# Patient Record
Sex: Female | Born: 1985
Health system: Southern US, Community
[De-identification: ages and names within clinical notes are randomized; demographics above are authoritative.]

## PROBLEM LIST (undated history)

## (undated) DIAGNOSIS — J45909 Unspecified asthma, uncomplicated: Secondary | ICD-10-CM

## (undated) HISTORY — DX: Unspecified asthma, uncomplicated: J45.909

---

## 2017-09-29 DIAGNOSIS — Z719 Counseling, unspecified: Secondary | ICD-10-CM | POA: Diagnosis not present

## 2017-10-24 DIAGNOSIS — Z719 Counseling, unspecified: Secondary | ICD-10-CM | POA: Diagnosis not present

## 2018-04-30 ENCOUNTER — Ambulatory Visit: Payer: 59 | Attending: Family Medicine | Admitting: Family Medicine

## 2018-04-30 ENCOUNTER — Encounter: Payer: Self-pay | Admitting: Family Medicine

## 2018-04-30 VITALS — BP 125/79 | HR 73 | Temp 98.6°F | Resp 18 | Ht 63.0 in | Wt 183.0 lb

## 2018-04-30 DIAGNOSIS — J3089 Other allergic rhinitis: Secondary | ICD-10-CM | POA: Diagnosis not present

## 2018-04-30 DIAGNOSIS — L309 Dermatitis, unspecified: Secondary | ICD-10-CM | POA: Diagnosis not present

## 2018-04-30 DIAGNOSIS — Z7951 Long term (current) use of inhaled steroids: Secondary | ICD-10-CM | POA: Insufficient documentation

## 2018-04-30 DIAGNOSIS — Z760 Encounter for issue of repeat prescription: Secondary | ICD-10-CM | POA: Diagnosis not present

## 2018-04-30 DIAGNOSIS — J4540 Moderate persistent asthma, uncomplicated: Secondary | ICD-10-CM | POA: Diagnosis not present

## 2018-04-30 MED ORDER — MONTELUKAST SODIUM 10 MG PO TABS: 10.0000 mg | ORAL_TABLET | Freq: Every day | ORAL | 4 refills | Status: DC

## 2018-04-30 MED ORDER — BUDESONIDE-FORMOTEROL FUMARATE 80-4.5 MCG/ACT IN AERO: 2.0000 | INHALATION_SPRAY | Freq: Two times a day (BID) | RESPIRATORY_TRACT | 4 refills | Status: DC

## 2018-04-30 MED ORDER — ALBUTEROL SULFATE HFA 108 (90 BASE) MCG/ACT IN AERS: 2.0000 | INHALATION_SPRAY | Freq: Four times a day (QID) | RESPIRATORY_TRACT | 4 refills | Status: DC | PRN

## 2018-04-30 MED ORDER — TRIAMCINOLONE ACETONIDE 0.1 % EX CREA: 1.0000 "application " | TOPICAL_CREAM | Freq: Two times a day (BID) | CUTANEOUS | 11 refills | Status: DC

## 2018-04-30 NOTE — Patient Instructions (Signed)

## 2018-04-30 NOTE — Progress Notes (Signed)
Subjective:    Patient ID: Lauren Terry, female    DOB: 06/26/1986, 32 y.o.   MRN: [id number]  HPI 32 yo female new to the practice who is here as a new patient to establish ongoing care of chronic medical issues including asthma and eczema. Patient reports that she awakens nightly with SOB and chest tightness. Asthma is triggered by fragrances, odors, cleaning chemicals and dust/pollen. Patient is only on albuterol and has to use her inhaler on a daily basis. Patient also with eczema and has used traimcinolone cream which has been helpful. Patient does have recurrent nasal congestion and postnasal drainage but is not on allergy medication.      Family history is significant for mother with Hypertension but no family members with any types of cancer, no heart disease, no strokes and no diabetes except for great maternal aunt. Patient does smoke, 2 cigarettes per day. Patient is single and has 4 children. She is currently employed. Patient declines influenza immunization at today's visit.   No Known Allergies   Review of Systems  Constitutional: Positive for fatigue (Occasional). Negative for chills and fever.  HENT: Positive for congestion and postnasal drip. Negative for sinus pain, sore throat and trouble swallowing.   Respiratory: Positive for chest tightness and shortness of breath. Negative for cough.   Cardiovascular: Negative for chest pain, palpitations and leg swelling.  Gastrointestinal: Negative for abdominal pain and nausea.  Endocrine: Negative for polydipsia, polyphagia and polyuria.  Genitourinary: Negative for dysuria and frequency.  Musculoskeletal: Negative for gait problem and joint swelling.  Allergic/Immunologic: Positive for environmental allergies. Negative for food allergies and immunocompromised state.  Neurological: Negative for dizziness and headaches.       Objective:   Physical Exam  Constitutional: She is oriented to person, place, and time. She appears  well-developed and well-nourished. No distress.  HENT:  Right Ear: Tympanic membrane, external ear and ear canal normal.  Left Ear: Tympanic membrane, external ear and ear canal normal.  Nose: Mucosal edema and rhinorrhea (Mild clear nasal discharge) present.  Mouth/Throat: Oropharynx is clear and moist.  Eyes: Conjunctivae and EOM are normal.  Neck: Normal range of motion. Neck supple. Thyromegaly present.  Cardiovascular: Normal rate and regular rhythm.  Pulmonary/Chest: Effort normal and breath sounds normal.  Abdominal: Soft. There is no tenderness.  Musculoskeletal: She exhibits no edema or tenderness.  Neurological: She is alert and oriented to person, place, and time. No cranial nerve deficit.  Skin: Skin is dry.  Patient with patches of dry skin on her face bilaterally and arms   BP 125/79 (BP Location: Left Arm, Patient Position: Sitting, Cuff Size: Large)   Pulse 73   Temp 98.6 F (37 C) (Oral)   Resp 18   Ht 5\' 3"  (1.6 m)   Wt 183 lb (83 kg)   LMP 04/30/2018   SpO2 99%   BMI 32.42 kg/m      Assessment & Plan:  1. Moderate persistent asthma, unspecified whether complicated Patient with complaint of nightly awakening secondary to shortness of breath and chest tightness.  Patient also with daily use of her inhaler.  Patient with moderate, uncontrolled persistent asthma.  Will add Symbicort 80-4 0.5 to take 2 puffs 2 times daily with as needed use of albuterol.  Patient also provided with prescription for Singulair as this may also help with her allergic rhinitis and asthma.  Patient will follow-up in 4 weeks to see if she has had improvement in her asthma symptoms.  If patient still with daily shortness of breath will increase dose of her Symbicort.  Patient education provided regarding asthma as part of after visit summary.  2. Eczema, unspecified type Patient with eczema and patient was given patient education regarding eczema.  Prescription refill for triamcinolone which  is worked well for the patient in the past.  3. Non-seasonal allergic rhinitis, unspecified trigger Patient will be placed on Singulair which will also help with her allergic rhinitis.  If patient with continued symptoms, suggested that she may also need a nonsedating antihistamine such as Allegra or Claritin.  Allergies as of 04/30/2018   No Known Allergies     Medication List        Accurate as of 04/30/18 11:59 PM. Always use your most recent med list.          albuterol 108 (90 Base) MCG/ACT inhaler Commonly known as:  PROVENTIL HFA;VENTOLIN HFA Inhale 2 puffs into the lungs every 6 (six) hours as needed for wheezing or shortness of breath.   budesonide-formoterol 80-4.5 MCG/ACT inhaler Commonly known as:  SYMBICORT Inhale 2 puffs into the lungs 2 (two) times daily.   montelukast 10 MG tablet Commonly known as:  SINGULAIR Take 1 tablet (10 mg total) by mouth at bedtime.   triamcinolone cream 0.1 % Commonly known as:  KENALOG Apply 1 application topically 2 (two) times daily.     An After Visit Summary was printed and given to the patient.  Return in about 4 weeks (around 05/28/2018) for asthma follow-up.

## 2018-05-28 ENCOUNTER — Encounter: Payer: Self-pay | Admitting: Family Medicine

## 2018-05-28 ENCOUNTER — Other Ambulatory Visit (HOSPITAL_COMMUNITY)
Admission: RE | Admit: 2018-05-28 | Discharge: 2018-05-28 | Disposition: A | Discharge status: Home / Self Care | Payer: 59 | Source: Ambulatory Visit | Attending: Family Medicine | Admitting: Family Medicine

## 2018-05-28 ENCOUNTER — Ambulatory Visit (HOSPITAL_BASED_OUTPATIENT_CLINIC_OR_DEPARTMENT_OTHER): Payer: 59 | Admitting: Family Medicine

## 2018-05-28 VITALS — BP 121/76 | HR 59 | Temp 98.3°F | Ht 63.0 in | Wt 181.2 lb

## 2018-05-28 DIAGNOSIS — N898 Other specified noninflammatory disorders of vagina: Secondary | ICD-10-CM | POA: Insufficient documentation

## 2018-05-28 DIAGNOSIS — N6310 Unspecified lump in the right breast, unspecified quadrant: Secondary | ICD-10-CM | POA: Insufficient documentation

## 2018-05-28 DIAGNOSIS — F17210 Nicotine dependence, cigarettes, uncomplicated: Secondary | ICD-10-CM | POA: Insufficient documentation

## 2018-05-28 DIAGNOSIS — N63 Unspecified lump in unspecified breast: Secondary | ICD-10-CM | POA: Insufficient documentation

## 2018-05-28 DIAGNOSIS — N6341 Unspecified lump in right breast, subareolar: Secondary | ICD-10-CM | POA: Diagnosis not present

## 2018-05-28 DIAGNOSIS — Z124 Encounter for screening for malignant neoplasm of cervix: Secondary | ICD-10-CM

## 2018-05-28 DIAGNOSIS — J4540 Moderate persistent asthma, uncomplicated: Secondary | ICD-10-CM | POA: Diagnosis not present

## 2018-05-28 DIAGNOSIS — Z79899 Other long term (current) drug therapy: Secondary | ICD-10-CM | POA: Insufficient documentation

## 2018-05-28 MED ORDER — TRIAMCINOLONE ACETONIDE 0.5 % EX OINT: 1.0000 "application " | TOPICAL_OINTMENT | Freq: Two times a day (BID) | CUTANEOUS | 6 refills | Status: DC

## 2018-05-28 NOTE — Progress Notes (Signed)
Subjective:    Patient ID: Lauren Terry, female    DOB: 04/26/1986, 32 y.o.   MRN: [id number]  HPI 32 yo female who presents secondary to needing her pap smear done and patient with complaint of recently noticing bruising to her right breast as well as a palpable nodule. Patient does not recall any specific injury to her right breast that may have caused the bruising. Patient states that she has to lift objects at work and perhaps something did hit her breast. Patient does not have any breast pain other than some discomfort if she touches the bruised area. There is no family history of breast cancer or other GYN cancer. Patient has had no prior abnormal pap smears and denies any current abdominal or pelvic pain. Patient has had some vaginal discharge that is mostly clear.        Patient is taking the asthma medications prescribed at her last visit and has had decreased SOB and decreased nighttime awakenings with SOB and chest tightness. Pt continues to smoke a few cigarettes daily. No past surgeries.     Review of Systems  Constitutional: Negative for chills, fatigue and fever.  Respiratory: Negative for cough, chest tightness, shortness of breath and wheezing.   Cardiovascular: Negative for chest pain, palpitations and leg swelling.  Gastrointestinal: Negative for abdominal pain and nausea.  Endocrine: Negative for polydipsia, polyphagia and polyuria.  Genitourinary: Positive for vaginal discharge. Negative for dysuria, frequency, pelvic pain and vaginal pain.  Musculoskeletal: Negative for arthralgias and back pain.  Neurological: Negative for dizziness and headaches.       Objective:   Physical Exam  Constitutional: She appears well-developed and well-nourished. No distress.  Neck: Normal range of motion. Neck supple.  Cardiovascular: Normal rate.  Pulmonary/Chest: Effort normal and breath sounds normal. She has no wheezes.  Abdominal: Soft. Bowel sounds are normal. There is no  tenderness.  Genitourinary: Vagina normal and uterus normal. Vaginal discharge: mucoid clear to light yellow discharge on the cervix and nearby vaginal canal.  Genitourinary Comments: Normal appearance to the cervix and no cervical motion tenderness and no adnexal tenderness  Musculoskeletal: She exhibits no edema or tenderness.  Lymphadenopathy:    She has no cervical adenopathy.  Skin:  On examination of the right breast, patient with a large bruise-somewhat circular with outside of bruise being purple and inner portion a yellow-green in color; area is slightly tender to palp and center of the breast at about 12 o'clock has a palpable somewhat firm nodule about an inch above the areola. No left or right axillary adenopathy and left breast exam is normal   BP 121/76   Pulse (!) 59   Temp 98.3 F (36.8 C) (Oral)   Ht 5\' 3"  (1.6 m)   Wt 181 lb 3.2 oz (82.2 kg)   LMP 04/30/2018   SpO2 99%   BMI 32.10 kg/m  vital signs and nurse's note reviewed        Assessment & Plan:  1. Screening for cervical cancer Pap done at today's visit as a screening test for cervical cancer and patient will be notified of the results - Cytology - PAP  2. Breast nodule Patient with an area of bruising on the right breast as well as a palpable nodule in this same area. Patient will be scheduled for diagnostic mammogram - MM Digital Diagnostic Bilat; Future  3. Vaginal discharge Sample of vaginal discharge obtained while performing pap smear and will be sent for testing and patient  will be notified if any further treatment is needed based on these results - Cytology - PAP  *Patient was offered influenza immunization at today's visit which she declined  An After Visit Summary was printed and given to the patient.  Return in about 6 months (around 11/26/2018) for asthma .

## 2018-05-29 LAB — CYTOLOGY - PAP
Bacterial vaginitis: NEGATIVE
Candida vaginitis: NEGATIVE
Chlamydia: NEGATIVE
Diagnosis: NEGATIVE
Neisseria Gonorrhea: NEGATIVE
Trichomonas: NEGATIVE

## 2018-05-30 ENCOUNTER — Telehealth: Payer: Self-pay

## 2018-05-30 DIAGNOSIS — J4540 Moderate persistent asthma, uncomplicated: Secondary | ICD-10-CM | POA: Insufficient documentation

## 2018-05-30 NOTE — Telephone Encounter (Signed)
Patient was called, verified dob, and was given most recent lab results. Patient verbalized understanding and had no further questions.  

## 2018-05-30 NOTE — Telephone Encounter (Signed)
-----   Message from Cain Saupeammie Fulp, MD sent at 05/29/2018  5:18 PM EDT ----- Notify patient of normal pap smear and no evidence of infection

## 2018-06-12 ENCOUNTER — Other Ambulatory Visit: Payer: Self-pay | Admitting: Family Medicine

## 2018-06-12 DIAGNOSIS — N63 Unspecified lump in unspecified breast: Secondary | ICD-10-CM

## 2018-06-26 ENCOUNTER — Other Ambulatory Visit: Payer: Self-pay | Admitting: Family Medicine

## 2018-06-26 DIAGNOSIS — N63 Unspecified lump in unspecified breast: Secondary | ICD-10-CM

## 2018-07-04 ENCOUNTER — Other Ambulatory Visit: Payer: 59

## 2018-07-19 ENCOUNTER — Ambulatory Visit
Admission: RE | Admit: 2018-07-19 | Discharge: 2018-07-19 | Disposition: A | Discharge status: Home / Self Care | Payer: 59 | Source: Ambulatory Visit | Attending: Family Medicine | Admitting: Family Medicine

## 2018-07-19 DIAGNOSIS — N63 Unspecified lump in unspecified breast: Secondary | ICD-10-CM

## 2018-07-19 DIAGNOSIS — N6489 Other specified disorders of breast: Secondary | ICD-10-CM | POA: Diagnosis not present

## 2018-07-19 DIAGNOSIS — R928 Other abnormal and inconclusive findings on diagnostic imaging of breast: Secondary | ICD-10-CM | POA: Diagnosis not present

## 2018-07-27 ENCOUNTER — Other Ambulatory Visit: Payer: 59

## 2018-07-30 ENCOUNTER — Telehealth (INDEPENDENT_AMBULATORY_CARE_PROVIDER_SITE_OTHER): Payer: Self-pay

## 2018-07-30 NOTE — Telephone Encounter (Signed)
-----   Message from Cain Saupeammie Fulp, MD sent at 07/20/2018  6:16 PM EST ----- Notify patient that mammogram and US were normal; patient does have category B breast tissue which indicates presence of scattered fibroglandular density

## 2018-07-30 NOTE — Telephone Encounter (Signed)
Patient verified DOB. She is aware that mammogram and US were normal. She was informed that she has category B breast tissue, meaning there is presence of scattered fibroglandular density. Patient asked about pap results and was informed that pap was normal. Maryjean Mornempestt S Roberts, CMA

## 2018-08-28 DIAGNOSIS — J069 Acute upper respiratory infection, unspecified: Secondary | ICD-10-CM | POA: Diagnosis not present

## 2018-10-22 ENCOUNTER — Emergency Department (HOSPITAL_COMMUNITY)
Admission: EM | Admit: 2018-10-22 | Discharge: 2018-10-22 | Disposition: A | Discharge status: Home / Self Care | Payer: 59 | Attending: Emergency Medicine | Admitting: Emergency Medicine

## 2018-10-22 ENCOUNTER — Other Ambulatory Visit: Payer: Self-pay

## 2018-10-22 ENCOUNTER — Encounter (HOSPITAL_COMMUNITY): Payer: Self-pay | Admitting: Obstetrics and Gynecology

## 2018-10-22 DIAGNOSIS — Z79899 Other long term (current) drug therapy: Secondary | ICD-10-CM | POA: Diagnosis not present

## 2018-10-22 DIAGNOSIS — J45909 Unspecified asthma, uncomplicated: Secondary | ICD-10-CM | POA: Diagnosis not present

## 2018-10-22 DIAGNOSIS — F17210 Nicotine dependence, cigarettes, uncomplicated: Secondary | ICD-10-CM | POA: Insufficient documentation

## 2018-10-22 DIAGNOSIS — R109 Unspecified abdominal pain: Secondary | ICD-10-CM

## 2018-10-22 DIAGNOSIS — R1013 Epigastric pain: Secondary | ICD-10-CM | POA: Insufficient documentation

## 2018-10-22 DIAGNOSIS — R112 Nausea with vomiting, unspecified: Secondary | ICD-10-CM | POA: Diagnosis not present

## 2018-10-22 LAB — I-STAT BETA HCG BLOOD, ED (MC, WL, AP ONLY): I-stat hCG, quantitative: <5 m[IU]/mL (ref <5)

## 2018-10-22 LAB — COMPREHENSIVE METABOLIC PANEL
ALT: 21 U/L (ref 0–44)
AST: 24 U/L (ref 15–41)
Albumin: 4 g/dL (ref 3.5–5.0)
Alkaline Phosphatase: 83 U/L (ref 38–126)
Anion gap: 7 (ref 5–15)
BUN: 11 mg/dL (ref 6–20)
CO2: 24 mmol/L (ref 22–32)
Calcium: 8.7 mg/dL — ABNORMAL LOW (ref 8.9–10.3)
Chloride: 106 mmol/L (ref 98–111)
Creatinine, Ser: 0.75 mg/dL (ref 0.44–1.00)
GFR calc Af Amer: >60 mL/min (ref >60)
GFR calc non Af Amer: >60 mL/min (ref >60)
Glucose, Bld: 98 mg/dL (ref 70–99)
Potassium: 3.7 mmol/L (ref 3.5–5.1)
Sodium: 137 mmol/L (ref 135–145)
Total Bilirubin: 0.2 mg/dL — ABNORMAL LOW (ref 0.3–1.2)
Total Protein: 7.2 g/dL (ref 6.5–8.1)

## 2018-10-22 LAB — URINALYSIS, ROUTINE W REFLEX MICROSCOPIC
Bacteria, UA: NONE SEEN
Bilirubin Urine: NEGATIVE
Glucose, UA: NEGATIVE mg/dL
Ketones, ur: NEGATIVE mg/dL
Leukocytes, UA: NEGATIVE
Nitrite: NEGATIVE
Protein, ur: NEGATIVE mg/dL
Specific Gravity, Urine: 1.004 — ABNORMAL LOW (ref 1.005–1.030)
pH: 7 (ref 5.0–8.0)

## 2018-10-22 LAB — CBC
HCT: 38.6 % (ref 36.0–46.0)
Hemoglobin: 11.7 g/dL — ABNORMAL LOW (ref 12.0–15.0)
MCH: 27.7 pg (ref 26.0–34.0)
MCHC: 30.3 g/dL (ref 30.0–36.0)
MCV: 91.5 fL (ref 80.0–100.0)
Platelets: 326 10*3/uL (ref 150–400)
RBC: 4.22 MIL/uL (ref 3.87–5.11)
RDW: 14.8 % (ref 11.5–15.5)
WBC: 9.8 10*3/uL (ref 4.0–10.5)
nRBC: 0 % (ref 0.0–0.2)

## 2018-10-22 LAB — LIPASE, BLOOD: Lipase: 33 U/L (ref 11–51)

## 2018-10-22 MED ORDER — IPRATROPIUM-ALBUTEROL 0.5-2.5 (3) MG/3ML IN SOLN
3.0000 mL | Freq: Once | RESPIRATORY_TRACT | Status: AC
  Administered 2018-10-22: 3 mL via RESPIRATORY_TRACT
  Filled 2018-10-22: qty 3

## 2018-10-22 MED ORDER — SODIUM CHLORIDE 0.9% FLUSH
3.0000 mL | Freq: Once | INTRAVENOUS | Status: AC
  Administered 2018-10-22: 3 mL via INTRAVENOUS

## 2018-10-22 MED ORDER — ALUM & MAG HYDROXIDE-SIMETH 200-200-20 MG/5ML PO SUSP
30.0000 mL | Freq: Once | ORAL | Status: AC
  Administered 2018-10-22: 30 mL via ORAL
  Filled 2018-10-22: qty 30

## 2018-10-22 MED ORDER — FAMOTIDINE 20 MG PO TABS
20.0000 mg | ORAL_TABLET | Freq: Once | ORAL | Status: AC
  Administered 2018-10-22: 20 mg via ORAL
  Filled 2018-10-22: qty 1

## 2018-10-22 MED ORDER — FAMOTIDINE 20 MG PO TABS: 20.0000 mg | ORAL_TABLET | Freq: Two times a day (BID) | ORAL | 0 refills | Status: DC

## 2018-10-22 NOTE — ED Provider Notes (Signed)
[facility]-EMERGENCY DEPT Provider Note   CSN: [id number] Arrival date & time: 10/22/18  4098     History   Chief Complaint Chief Complaint  Patient presents with  . Abdominal Pain    HPI Lauren Terry is a 33 y.o. female.  HPI   Pt is a 33 y/o female with a h/o asthma who presents to the ED today c/o abd pain that began last night. Pain woke her from sleep. Pain located to the epigastric area.  Pain has improved since onset and she currently does not have pain. Last night pain was worse and was rated at 8/10.  Pain is radiating to the back.  Pain is constant. She reports chills, HA, nausea, and constipation. No diarrhea, vomiting, bloody stools. Still passing flatus. No urinary sxs.  She had fried chicken wings last night with a salad.   She denies chest pain. She has some shortness of breath that she states is chronic for her. She has asthma and is supposed to take symbicort and albuterol but she does not take her symbicort so has more wheezing today.   Also reports some mild uri sxs, with cough, congestion, rhinorrhea that began a few days ago.  She states she has a chronic cough because of rapid asthma and it is not worse today.  Past Medical History:  Diagnosis Date  . Asthma     Patient Active Problem List   Diagnosis Date Noted  . Moderate persistent asthma 05/30/2018    No past surgical history on file.   OB History    Gravida      Para      Term      Preterm      AB      Living  4     SAB      TAB      Ectopic      Multiple      Live Births               Home Medications    Prior to Admission medications   Medication Sig Start Date End Date Taking? Authorizing Provider  acetaminophen (TYLENOL) 325 MG tablet Take 650 mg by mouth every 6 (six) hours as needed for mild pain or headache.   Yes [provider]  albuterol (PROVENTIL HFA;VENTOLIN HFA) 108 (90 Base) MCG/ACT inhaler Inhale 2 puffs into the  lungs every 6 (six) hours as needed for wheezing or shortness of breath. 04/30/18  Yes Fulp, Cammie, MD  Pseudoeph-Doxylamine-DM-APAP (NYQUIL PO) Take 1 capsule by mouth at bedtime as needed (congestion/sleep).   Yes [provider]  triamcinolone ointment (KENALOG) 0.5 % Apply 1 application topically 2 (two) times daily. Patient taking differently: Apply 1 application topically 2 (two) times daily as needed (eczema).  05/28/18  Yes Fulp, Cammie, MD  budesonide-formoterol (SYMBICORT) 80-4.5 MCG/ACT inhaler Inhale 2 puffs into the lungs 2 (two) times daily. Patient not taking: Reported on 10/22/2018 04/30/18   Fulp, Hewitt Shorts, MD  famotidine (PEPCID) 20 MG tablet Take 1 tablet (20 mg total) by mouth 2 (two) times daily for 14 days. 10/22/18 11/05/18  Quinetta Shilling S, PA-C  montelukast (SINGULAIR) 10 MG tablet Take 1 tablet (10 mg total) by mouth at bedtime. Patient not taking: Reported on 10/22/2018 04/30/18   Cain Saupe, MD    Family History Family History  Problem Relation Age of Onset  . Diabetes Maternal Aunt   . Cancer Paternal Grandmother   . Breast cancer  Paternal Grandmother     Social History Social History   Tobacco Use  . Smoking status: Current Some Day Smoker    Packs/day: 0.01    Types: Cigarettes  . Smokeless tobacco: Never Used  Substance Use Topics  . Alcohol use: Not Currently  . Drug use: Yes    Types: Marijuana     Allergies   Patient has no known allergies.   Review of Systems Review of Systems  Constitutional: Negative for chills and fever.  HENT: Positive for congestion and rhinorrhea. Negative for ear pain and sore throat.   Eyes: Negative for visual disturbance.  Respiratory: Positive for cough and wheezing. Negative for shortness of breath.   Cardiovascular: Negative for chest pain.  Gastrointestinal: Positive for abdominal pain, constipation, nausea and vomiting. Negative for blood in stool and diarrhea.  Genitourinary: Negative for dysuria,  flank pain and hematuria.  Musculoskeletal: Negative for back pain.  Skin: Negative for color change and rash.  Neurological: Negative for seizures, syncope and headaches.  All other systems reviewed and are negative.    Physical Exam Updated Vital Signs BP 129/76 (BP Location: Right Arm)   Pulse 93   Temp 98.7 F (37.1 C) (Oral)   Resp 17   LMP 10/22/2018   SpO2 100%   Physical Exam Vitals signs and nursing note reviewed.  Constitutional:      General: She is not in acute distress.    Appearance: She is well-developed. She is not ill-appearing or toxic-appearing.     Comments: Patient appears very comfortable.  HENT:     Head: Normocephalic and atraumatic.  Eyes:     Conjunctiva/sclera: Conjunctivae normal.  Neck:     Musculoskeletal: Neck supple.  Cardiovascular:     Rate and Rhythm: Normal rate and regular rhythm.     Heart sounds: Normal heart sounds. No murmur.  Pulmonary:     Effort: Pulmonary effort is normal. No respiratory distress.     Comments: Bilateral upper expiratory wheezing.  No rales or rhonchi.  No tachypnea.  Speaking in full sentences. Abdominal:     Palpations: Abdomen is soft.     Tenderness: There is no right CVA tenderness or left CVA tenderness.     Comments: Very mild left upper quadrant tenderness but abdomen is soft, there is no rebound, rigidity or guarding.  Bowel sounds are normal.  Skin:    General: Skin is warm and dry.  Neurological:     Mental Status: She is alert.  Psychiatric:        Mood and Affect: Mood normal.      ED Treatments / Results  Labs (all labs ordered are listed, but only abnormal results are displayed) Labs Reviewed  COMPREHENSIVE METABOLIC PANEL - Abnormal; Notable for the following components:      Result Value   Calcium 8.7 (*)    Total Bilirubin 0.2 (*)    All other components within normal limits  CBC - Abnormal; Notable for the following components:   Hemoglobin 11.7 (*)    All other components  within normal limits  URINALYSIS, ROUTINE W REFLEX MICROSCOPIC - Abnormal; Notable for the following components:   Specific Gravity, Urine 1.004 (*)    Hgb urine dipstick MODERATE (*)    All other components within normal limits  LIPASE, BLOOD  I-STAT BETA HCG BLOOD, ED ([facility], [facility], [facility] ONLY)    EKG None  Radiology No results found.  Procedures Procedures (including critical care time)  Medications Ordered in  ED Medications  sodium chloride flush (NS) 0.9 % injection 3 mL (3 mLs Intravenous Given 10/22/18 0826)  famotidine (PEPCID) tablet 20 mg (20 mg Oral Given 10/22/18 0909)  alum & mag hydroxide-simeth (MAALOX/MYLANTA) 200-200-20 MG/5ML suspension 30 mL (30 mLs Oral Given 10/22/18 0909)  ipratropium-albuterol (DUONEB) 0.5-2.5 (3) MG/3ML nebulizer solution 3 mL (3 mLs Nebulization Given 10/22/18 0910)     Initial Impression / Assessment and Plan / ED Course  I have reviewed the triage vital signs and the nursing notes.  Pertinent labs & imaging results that were available during my care of the patient were reviewed by me and considered in my medical decision making (see chart for details).     Final Clinical Impressions(s) / ED Diagnoses   Final diagnoses:  Abdominal pain, unspecified abdominal location   Patient presenting the emergency department today with epigastric pain that radiated to her back when she woke up this morning.  It is associated with nausea but no vomiting.  No diarrhea but mild constipation.  No fevers or chills.  No chest pain.  She has chronic shortness of breath and a cough from her asthma that is unchanged today.  She has mild congestion and rhinorrhea but no fevers.  Exam she is very well-appearing.  Her vital signs are normal.  Her abdomen is soft.  She has very minimal tenderness to the left upper quadrant without guarding, rebound or rigidity.  She states her symptoms have improved since being here.  She does endorse having spicy/fried meal last night  before going to bed.  Her labs are reassuring patient has no leukocytosis.  She has mild anemia.  Electrolytes are normal.  Kidney and liver function are normal.  Lipase is negative.  UA is negative for urinary tract infection.  She does have hematuria, she is on her menses.  I suspect that her symptoms were secondary to GERD versus gastritis given her history.  Her symptoms have already improved significantly without intervention.  Pepcid and Maalox given in the ED and patient feels improved after medications.   Also given a DuoNeb that she is due for her albuterol inhaler. Abdomen soft and nontender. Wheezing improved. She has not been using her Symbicort, I advised her on importance of using this.  I will give her Rx for Pepcid for home and have her follow-up with her PCP.  Have advised her to return to the ER for new or worsening symptoms in the meantime.  She voiced understanding the plan and reasons return.  Questions answered.  Patient stable for discharge.  ED Discharge Orders         Ordered    famotidine (PEPCID) 20 MG tablet  2 times daily     10/22/18 0916           Karrie Meres, PA-C 10/22/18 1015    Derwood Kaplan, MD 10/23/18 [phone]

## 2018-10-22 NOTE — Discharge Instructions (Addendum)
Take prescriptions as advised.   Please follow up with your primary doctor within the next 5-7 days.  If you do not have a primary care provider, information for a healthcare clinic has been provided for you to make arrangements for follow up care. Please return to the ER sooner if you have any new or worsening symptoms, or if you have any of the following symptoms:  Abdominal pain that does not go away.  You have a fever.  You keep throwing up (vomiting).  The pain is felt only in portions of the abdomen. Pain in the right side could possibly be appendicitis. In an adult, pain in the left lower portion of the abdomen could be colitis or diverticulitis.  You pass bloody or black tarry stools.  There is bright red blood in the stool.  The constipation stays for more than 4 days.  There is belly (abdominal) or rectal pain.  You do not seem to be getting better.  You have any questions or concerns.

## 2018-10-22 NOTE — ED Triage Notes (Signed)
Pt reports upper abdominal pain since last night. Pt reports her son recently had the flu. Pt reports she is not pregnant at this time and is currently on her cycle. Pt denies diarrhea, and reports some difficulty with bowel movements. Pt denies nausea and emesis.

## 2018-11-12 ENCOUNTER — Encounter: Payer: Self-pay | Admitting: Family Medicine

## 2018-11-12 ENCOUNTER — Ambulatory Visit: Payer: 59 | Attending: Family Medicine | Admitting: Family Medicine

## 2018-11-12 VITALS — BP 111/64 | HR 73 | Temp 97.5°F | Ht 63.0 in | Wt 178.2 lb

## 2018-11-12 DIAGNOSIS — B9689 Other specified bacterial agents as the cause of diseases classified elsewhere: Secondary | ICD-10-CM

## 2018-11-12 DIAGNOSIS — N760 Acute vaginitis: Secondary | ICD-10-CM | POA: Diagnosis not present

## 2018-11-12 MED ORDER — METRONIDAZOLE 500 MG PO TABS: 500.0000 mg | ORAL_TABLET | Freq: Two times a day (BID) | ORAL | 0 refills | Status: DC

## 2018-11-12 NOTE — Progress Notes (Signed)
Subjective:  Patient ID: Lauren Terry, female    DOB: Dec 10, 1985  Age: 33 y.o. MRN: [id number]  CC: Hospitalization Follow-up   HPI Lauren Terry presents today complaining of a vaginal odor which she has had for the last couple of days.  She stayed in a hotel and had to use the hotel soaps and lotion and developed symptoms subsequently.  Denies vaginal itching, dysuria, discharge and has the same sexual partner -does not use protection.  She had an ED visit for abdominal pain last month where she was treated for gastritis and endorses resolution of symptoms.  Past Medical History:  Diagnosis Date  . Asthma     History reviewed. No pertinent surgical history.  Family History  Problem Relation Age of Onset  . Diabetes Maternal Aunt   . Cancer Paternal Grandmother   . Breast cancer Paternal Grandmother     No Known Allergies  Outpatient Medications Prior to Visit  Medication Sig Dispense Refill  . albuterol (PROVENTIL HFA;VENTOLIN HFA) 108 (90 Base) MCG/ACT inhaler Inhale 2 puffs into the lungs every 6 (six) hours as needed for wheezing or shortness of breath. 1 Inhaler 4  . budesonide-formoterol (SYMBICORT) 80-4.5 MCG/ACT inhaler Inhale 2 puffs into the lungs 2 (two) times daily. 1 Inhaler 4  . triamcinolone ointment (KENALOG) 0.5 % Apply 1 application topically 2 (two) times daily. (Patient taking differently: Apply 1 application topically 2 (two) times daily as needed (eczema). ) 30 g 6  . acetaminophen (TYLENOL) 325 MG tablet Take 650 mg by mouth every 6 (six) hours as needed for mild pain or headache.    . famotidine (PEPCID) 20 MG tablet Take 1 tablet (20 mg total) by mouth 2 (two) times daily for 14 days. 28 tablet 0  . montelukast (SINGULAIR) 10 MG tablet Take 1 tablet (10 mg total) by mouth at bedtime. (Patient not taking: Reported on 10/22/2018) 30 tablet 4  . Pseudoeph-Doxylamine-DM-APAP (NYQUIL PO) Take 1 capsule by mouth at bedtime as needed (congestion/sleep).       No facility-administered medications prior to visit.      ROS Review of Systems General: negative for fever, weight loss, appetite change Eyes: no visual symptoms. ENT: no ear symptoms, no sinus tenderness, no nasal congestion or sore throat. Neck: no pain  Respiratory: no wheezing, shortness of breath, cough Cardiovascular: no chest pain, no dyspnea on exertion, no pedal edema, no orthopnea. Gastrointestinal: no abdominal pain, no diarrhea, no constipation Genito-Urinary: no urinary frequency, no dysuria, no polyuria, + vaginal odor Hematologic: no bruising Endocrine: no cold or heat intolerance Neurological: no headaches, no seizures, no tremors Musculoskeletal: no joint pains, no joint swelling Skin: no pruritus, no rash. Psychological: no depression, no anxiety,    Objective:  BP 111/64   Pulse 73   Temp (!) 97.5 F (36.4 C) (Oral)   Ht 5\' 3"  (1.6 m)   Wt 178 lb 3.2 oz (80.8 kg)   LMP 10/22/2018   SpO2 98%   BMI 31.57 kg/m   BP/Weight 11/12/2018 10/22/2018 05/28/2018  Systolic BP 111 130 121  Diastolic BP 64 85 76  Wt. (Lbs) 178.2 - 181.2  BMI 31.57 - 32.1      Physical Exam Constitutional: normal appearing,  Eyes: PERRLA HEENT: Head is atraumatic, normal sinuses, normal oropharynx, normal appearing tonsils and palate, tympanic membrane is normal bilaterally. Neck: normal range of motion, no thyromegaly, no JVD Cardiovascular: normal rate and rhythm, normal heart sounds, no murmurs, rub or gallop, no pedal edema Respiratory:  Normal breath sounds, clear to auscultation bilaterally, no wheezes, no rales, no rhonchi Abdomen: soft, not tender to palpation, normal bowel sounds, no enlarged organs Musculoskeletal: Full ROM, no tenderness in joints Skin: warm and dry, no lesions. Neurological: alert, oriented x3, cranial nerves I-XII grossly intact , normal motor strength, normal sensation. Psychological: normal mood.   CMP Latest Ref Rng & Units 10/22/2018   Glucose 70 - 99 mg/dL 98  BUN 6 - 20 mg/dL 11  Creatinine 4.32 - 7.61 mg/dL 4.70  Sodium 929 - 574 mmol/L 137  Potassium 3.5 - 5.1 mmol/L 3.7  Chloride 98 - 111 mmol/L 106  CO2 22 - 32 mmol/L 24  Calcium 8.9 - 10.3 mg/dL 7.3(U)  Total Protein 6.5 - 8.1 g/dL 7.2  Total Bilirubin 0.3 - 1.2 mg/dL 0.3(J)  Alkaline Phos 38 - 126 U/L 83  AST 15 - 41 U/L 24  ALT 0 - 44 U/L 21    Lipid Panel  No results found for: CHOL, TRIG, HDL, CHOLHDL, VLDL, LDLCALC, LDLDIRECT  CBC    Component Value Date/Time   WBC 9.8 10/22/2018 0737   RBC 4.22 10/22/2018 0737   HGB 11.7 (L) 10/22/2018 0737   HCT 38.6 10/22/2018 0737   PLT 326 10/22/2018 0737   MCV 91.5 10/22/2018 0737   MCH 27.7 10/22/2018 0737   MCHC 30.3 10/22/2018 0737   RDW 14.8 10/22/2018 0737      Assessment & Plan:   1. Bacterial vaginosis - metroNIDAZOLE (FLAGYL) 500 MG tablet; Take 1 tablet (500 mg total) by mouth 2 (two) times daily.  Dispense: 14 tablet; Refill: 0   Meds ordered this encounter  Medications  . metroNIDAZOLE (FLAGYL) 500 MG tablet    Sig: Take 1 tablet (500 mg total) by mouth 2 (two) times daily.    Dispense:  14 tablet    Refill:  0    Follow-up: Return if symptoms worsen or fail to improve.       Hoy Register, MD, FAAFP. [facility] [place], [place] [phone]   11/12/2018, 3:56 PM

## 2018-11-12 NOTE — Progress Notes (Signed)
Patient states she has a vaginal odor.

## 2018-12-06 ENCOUNTER — Telehealth: Payer: Self-pay | Admitting: Family Medicine

## 2018-12-06 ENCOUNTER — Encounter: Payer: Self-pay | Admitting: Family Medicine

## 2018-12-06 NOTE — Progress Notes (Signed)
Patient ID: Lauren Terry, female   DOB: Dec 14, 1985, 33 y.o.   MRN: [id number]   Message received that patient that patient needs a letter to give to her employer that patient has a diagnosis of asthma. Letter will be typed and given to CMA to contact patient regarding letter pick-up.

## 2018-12-06 NOTE — Telephone Encounter (Signed)
Patient came in asking for a letter stating she has asthma for work.

## 2018-12-06 NOTE — Telephone Encounter (Signed)
Letter has been printed for the patient and she may be contacted for pickup

## 2018-12-07 ENCOUNTER — Encounter: Payer: Self-pay | Admitting: Family Medicine

## 2018-12-07 NOTE — Telephone Encounter (Signed)
Pt called back in stating that she would like the note to her employer to state if possible that she is at risk to be at work so they can excuse her due to condition she doesn't wan to be exposed to the covid-19  Please follow up

## 2018-12-07 NOTE — Telephone Encounter (Signed)
Called the patient and lvm

## 2018-12-07 NOTE — Progress Notes (Signed)
Patient ID: Lauren Terry, female   DOB: 1985-11-17, 33 y.o.   MRN: [id number]   Patient left message yesterday requesting letter be written regarding her medical condition, asthma, that she could take to her employer.  Patient was contacted today by front office staff so that she could pick up the letter however patient now wishes for letter to be written to her employer to see if she can be excused from work due to her asthma due to her current COVID-19 pandemic.  Letter will be written that patient has asthma which places her in a high risk category for subsequent illness from respiratory illnesses such as COVID-19 virus.

## 2018-12-07 NOTE — Telephone Encounter (Signed)
New note was written for the patient and she can be contacted for pick-up

## 2018-12-14 IMAGING — MG DIGITAL DIAGNOSTIC BILATERAL MAMMOGRAM WITH TOMO AND CAD
6 of 10 series · 6 of 30 positions shown · non-contrast
Comparison: None.

CLINICAL DATA: Patient presents for bilateral diagnostic
examination due to a palpable abnormality over the upper mid to
inner right breast fell 1 month ago after mild trauma. Patient
states she can no longer feel this palpable abnormality today.
Family history of breast cancer in paternal grandmother unsure what
age..

EXAM:
DIGITAL DIAGNOSTIC bilateral MAMMOGRAM WITH CAD AND TOMO
ULTRASOUND right BREAST

[L CC synth-2D]
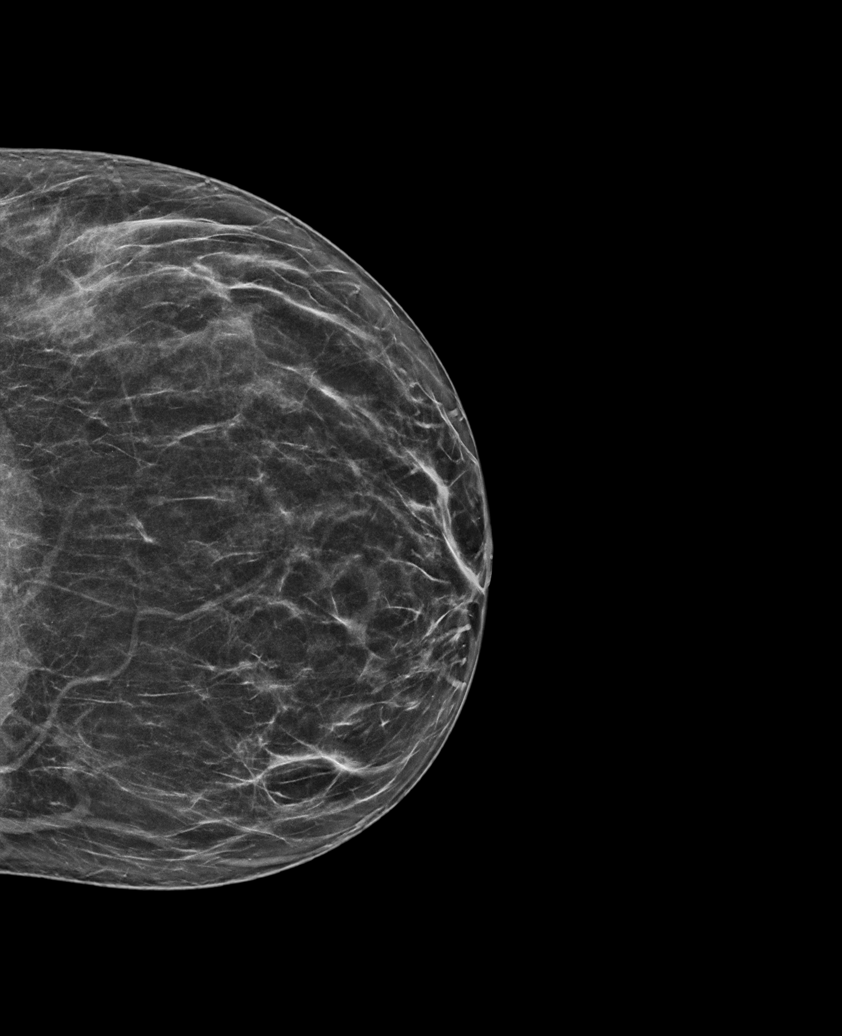

[R CC synth-2D]
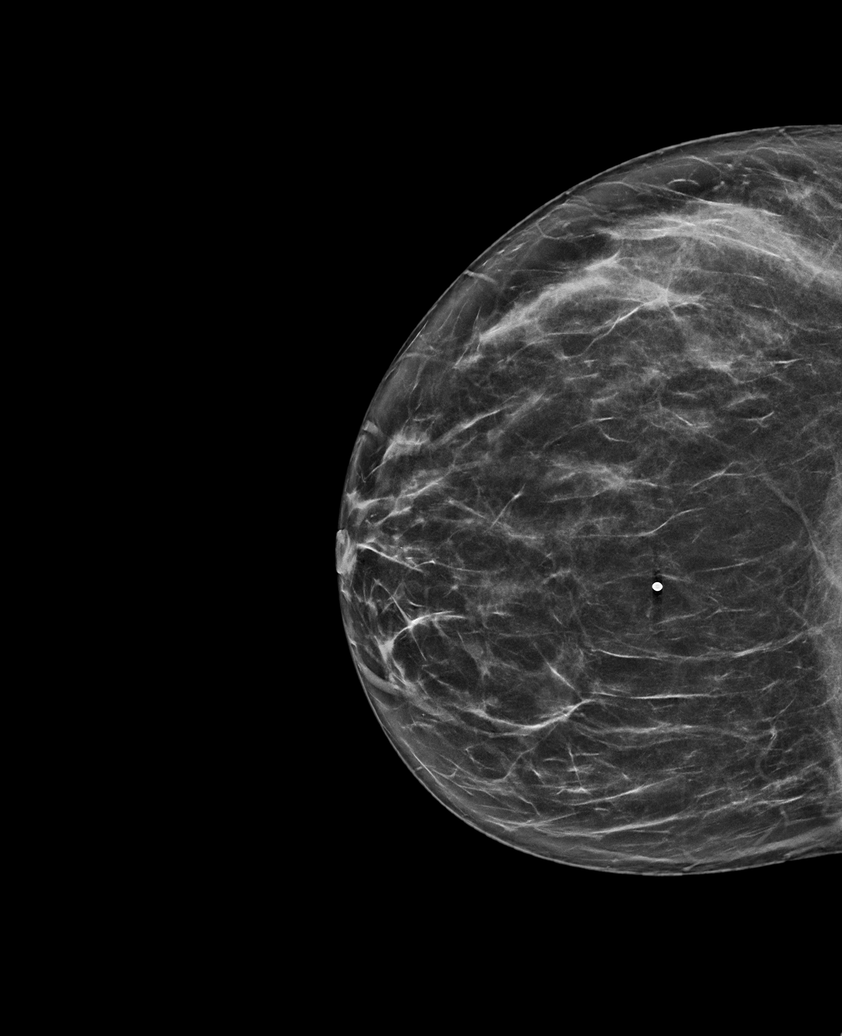

[R TAN synth-2D]
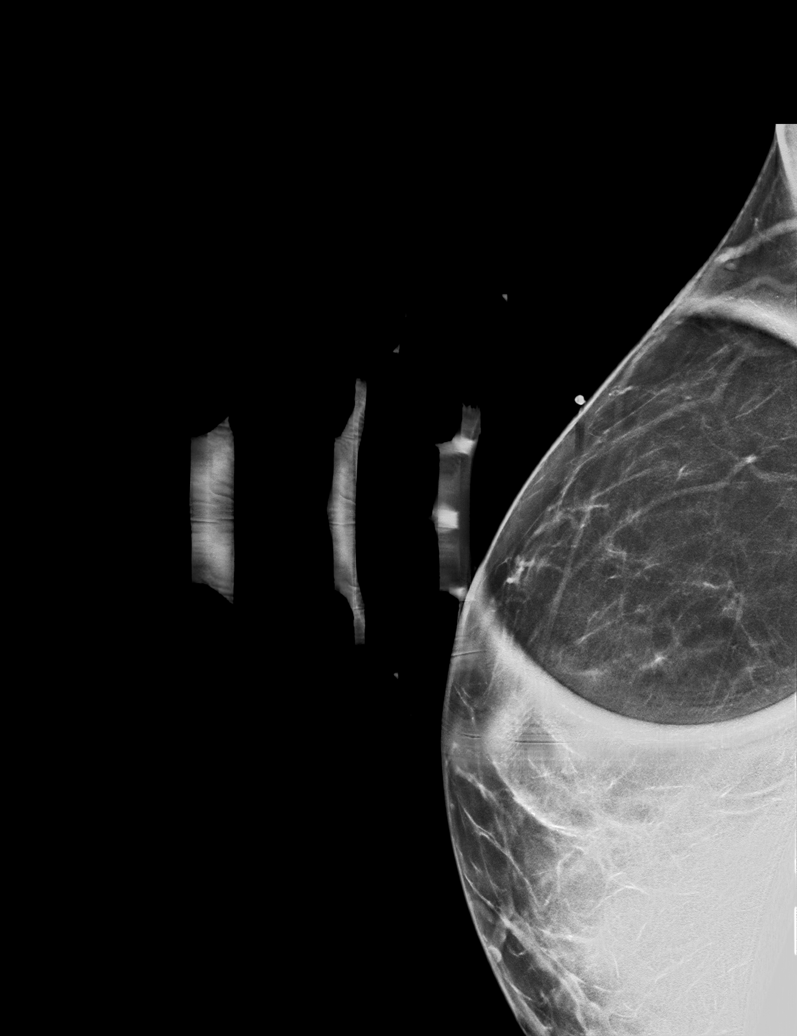

[L MLO synth-2D]
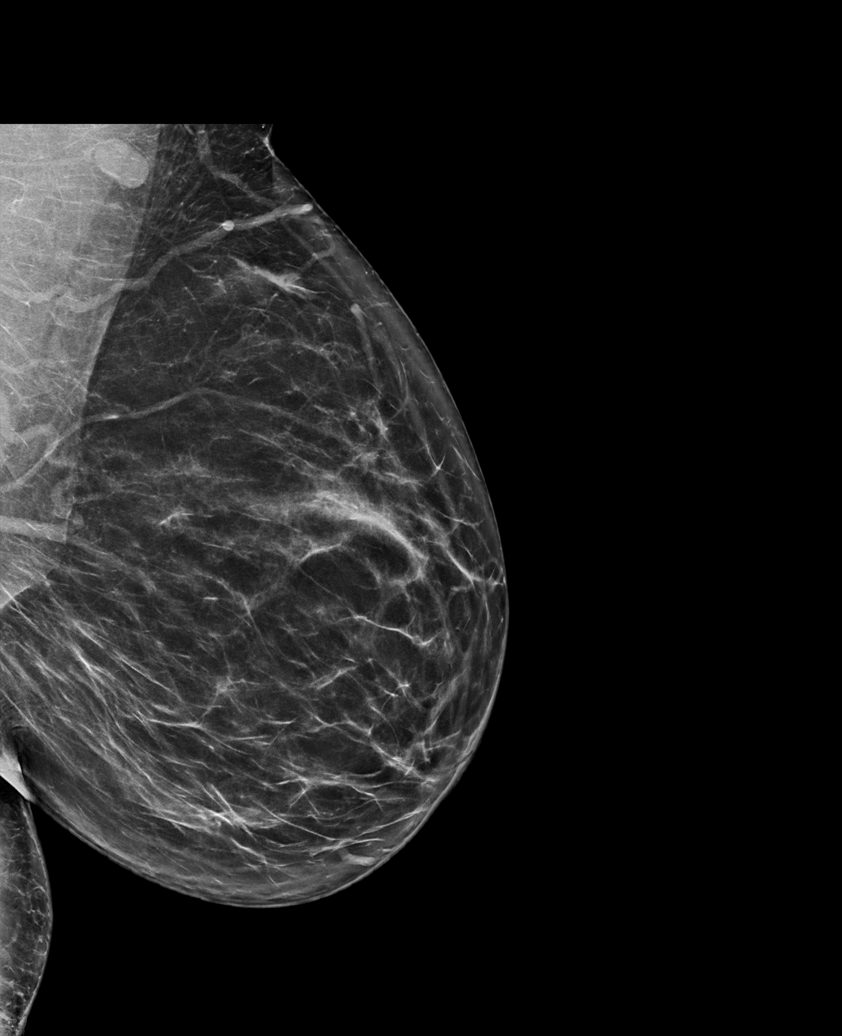

[R MLO synth-2D]
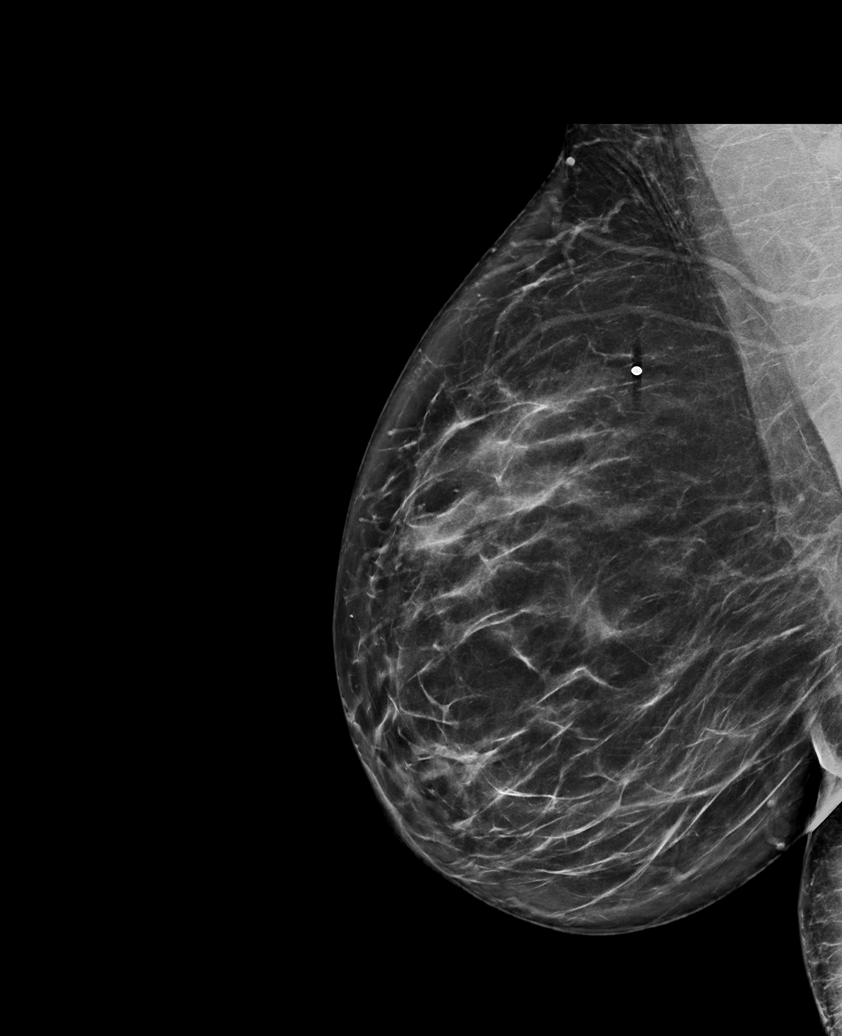

[R MLO tomo · tomo slice 43/86.0]
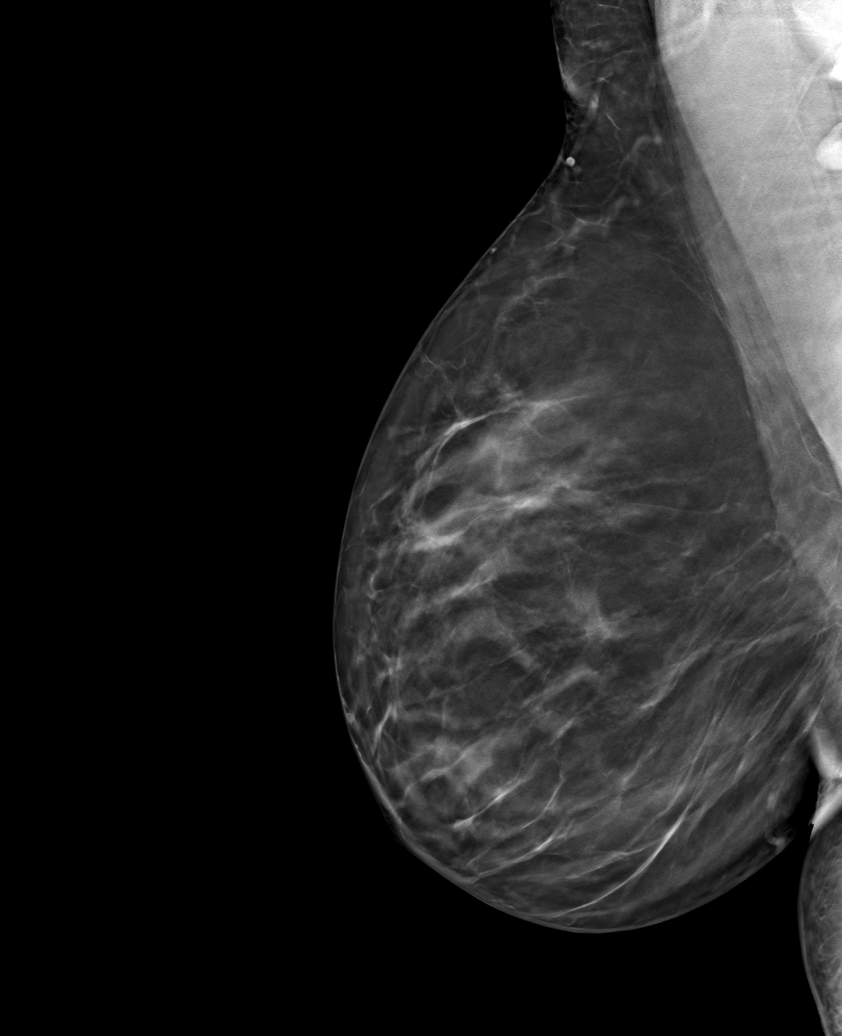

[6 of 30 positions shown; findings below may reference images not displayed]

ACR Breast Density Category b: There are scattered areas of
fibroglandular density.
FINDINGS: Examination demonstrates no focal abnormality over the upper mid to
inner right breast in the area of patient's recent palpable
abnormality which she can no longer feel today. Remainder of the
right breast as well as the left breast is within normal.

Mammographic images were processed with CAD.

Targeted ultrasound is performed, showing no focal abnormality over
the upper mid to inner right breast.
IMPRESSION: No focal abnormality over the upper mid to inner right breast to
account for patient's recent palpable abnormality.

RECOMMENDATION:
Recommend continued management of patient's palpable abnormality on
a clinical basis. Otherwise, recommend beginning annual screening
mammography at age 40.

I have discussed the findings and recommendations with the patient.
Results were also provided in writing at the conclusion of the
visit. If applicable, a reminder letter will be sent to the patient
regarding the next appointment.

BI-RADS CATEGORY  1: Negative.

## 2018-12-14 IMAGING — US ULTRASOUND RIGHT BREAST LIMITED
1 series · 2 of 2 positions shown · non-contrast
Comparison: None.

CLINICAL DATA: Patient presents for bilateral diagnostic
examination due to a palpable abnormality over the upper mid to
inner right breast fell 1 month ago after mild trauma. Patient
states she can no longer feel this palpable abnormality today.
Family history of breast cancer in paternal grandmother unsure what
age..

EXAM:
DIGITAL DIAGNOSTIC bilateral MAMMOGRAM WITH CAD AND TOMO
ULTRASOUND right BREAST

[Series 1: ultrasound right breast limited · 0.07mm/px · 2 of 2 slices shown]
[im 1/2]
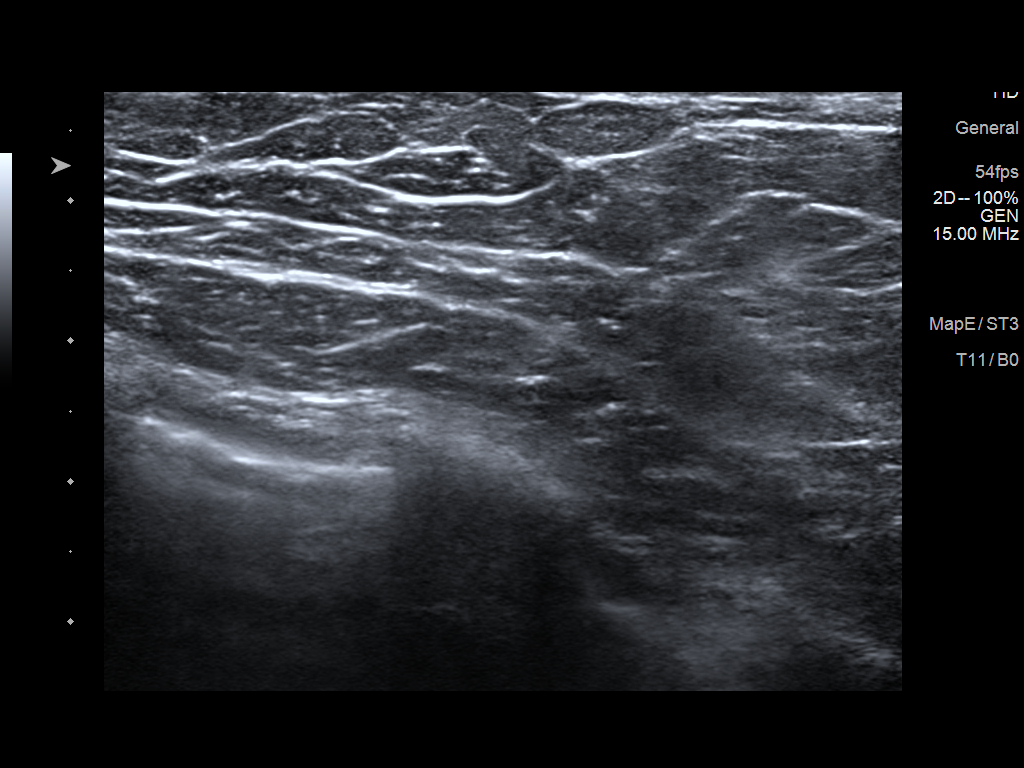
[im 2/2]
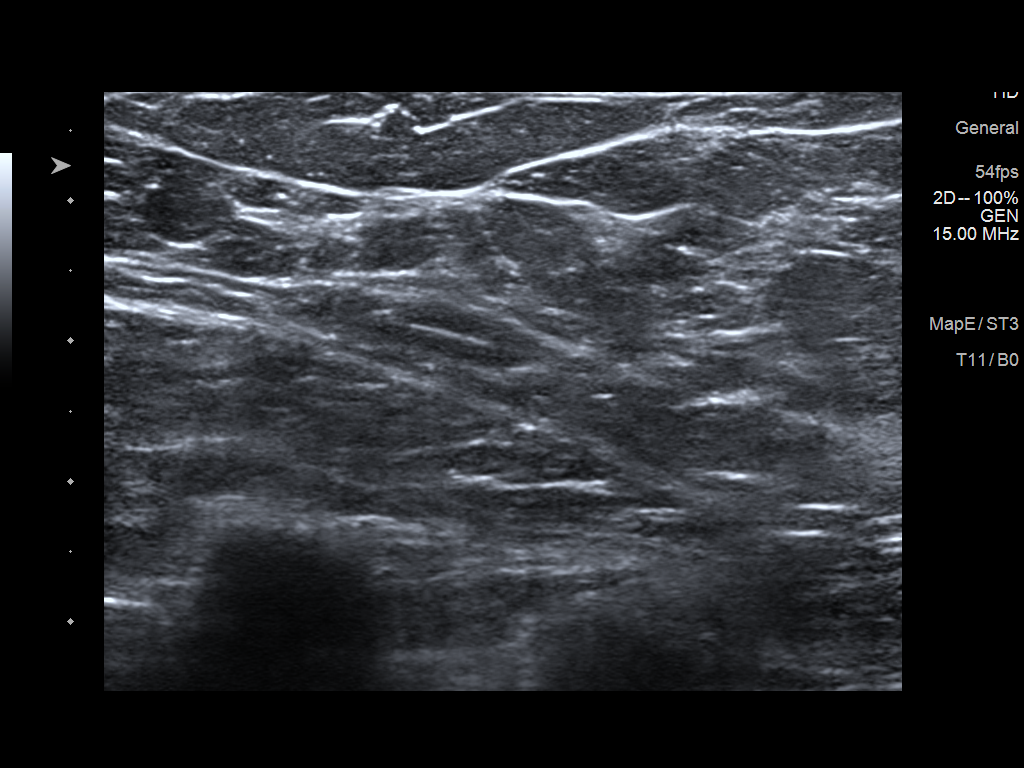

[2 of 2 positions shown; findings below may reference images not displayed]

ACR Breast Density Category b: There are scattered areas of
fibroglandular density.
FINDINGS: Examination demonstrates no focal abnormality over the upper mid to
inner right breast in the area of patient's recent palpable
abnormality which she can no longer feel today. Remainder of the
right breast as well as the left breast is within normal.

Mammographic images were processed with CAD.

Targeted ultrasound is performed, showing no focal abnormality over
the upper mid to inner right breast.
IMPRESSION: No focal abnormality over the upper mid to inner right breast to
account for patient's recent palpable abnormality.

RECOMMENDATION:
Recommend continued management of patient's palpable abnormality on
a clinical basis. Otherwise, recommend beginning annual screening
mammography at age 40.

I have discussed the findings and recommendations with the patient.
Results were also provided in writing at the conclusion of the
visit. If applicable, a reminder letter will be sent to the patient
regarding the next appointment.

BI-RADS CATEGORY  1: Negative.

## 2019-05-09 ENCOUNTER — Other Ambulatory Visit (HOSPITAL_COMMUNITY)
Admission: RE | Admit: 2019-05-09 | Discharge: 2019-05-09 | Disposition: A | Discharge status: Home / Self Care | Payer: 59 | Source: Ambulatory Visit | Attending: Family Medicine | Admitting: Family Medicine

## 2019-05-09 ENCOUNTER — Encounter: Payer: Self-pay | Admitting: Family Medicine

## 2019-05-09 ENCOUNTER — Other Ambulatory Visit: Payer: Self-pay

## 2019-05-09 ENCOUNTER — Ambulatory Visit (HOSPITAL_BASED_OUTPATIENT_CLINIC_OR_DEPARTMENT_OTHER): Payer: 59 | Admitting: Family Medicine

## 2019-05-09 VITALS — BP 128/79 | HR 83 | Temp 99.1°F | Wt 178.0 lb

## 2019-05-09 DIAGNOSIS — J4540 Moderate persistent asthma, uncomplicated: Secondary | ICD-10-CM | POA: Diagnosis not present

## 2019-05-09 DIAGNOSIS — Z113 Encounter for screening for infections with a predominantly sexual mode of transmission: Secondary | ICD-10-CM | POA: Insufficient documentation

## 2019-05-09 LAB — POCT URINALYSIS DIP (CLINITEK)
Bilirubin, UA: NEGATIVE
Glucose, UA: NEGATIVE mg/dL
Ketones, POC UA: NEGATIVE mg/dL
Leukocytes, UA: NEGATIVE
Nitrite, UA: NEGATIVE
POC PROTEIN,UA: NEGATIVE
Spec Grav, UA: >=1.03 — AB
Urobilinogen, UA: 1 U/dL
pH, UA: 6

## 2019-05-09 MED ORDER — BUDESONIDE-FORMOTEROL FUMARATE 80-4.5 MCG/ACT IN AERO: 2.0000 | INHALATION_SPRAY | Freq: Two times a day (BID) | RESPIRATORY_TRACT | 4 refills | Status: DC

## 2019-05-10 ENCOUNTER — Encounter: Payer: Self-pay | Admitting: Family Medicine

## 2019-05-13 LAB — URINE CYTOLOGY ANCILLARY ONLY: Candida vaginitis: NEGATIVE

## 2019-05-15 ENCOUNTER — Telehealth: Payer: Self-pay | Admitting: Family Medicine

## 2019-05-15 LAB — CERVICOVAGINAL ANCILLARY ONLY
Candida vaginitis: NEGATIVE
Chlamydia: NEGATIVE
Neisseria Gonorrhea: NEGATIVE
Trichomonas: NEGATIVE

## 2019-05-15 NOTE — Telephone Encounter (Signed)
Patient called to get their results. Please follow up. °

## 2019-05-15 NOTE — Telephone Encounter (Signed)
LMOM

## 2019-05-16 ENCOUNTER — Other Ambulatory Visit: Payer: Self-pay | Admitting: Family Medicine

## 2019-05-16 DIAGNOSIS — B9689 Other specified bacterial agents as the cause of diseases classified elsewhere: Secondary | ICD-10-CM

## 2019-05-16 MED ORDER — METRONIDAZOLE 500 MG PO TABS: 500.0000 mg | ORAL_TABLET | Freq: Two times a day (BID) | ORAL | 0 refills | Status: DC

## 2019-05-16 NOTE — Telephone Encounter (Signed)
Patient was given her results of her cervical ancillary and it was positive for BV. Patient is requesting pill form of medication.  [facility] on [address]

## 2019-05-16 NOTE — Telephone Encounter (Signed)
Patient was treated with metronidazole at her visit on the day that she had urine ancillary test done so this should have covered the bacterial vaginitis.  I will however send in a new prescription to her pharmacy if she is continuing to have symptoms

## 2019-05-16 NOTE — Progress Notes (Signed)
Patient ID: Lauren Terry, female   DOB: February 14, 1986, 33 y.o.   MRN: [id number]   Recent urine cytology testing shows presence of overgrowth of normal bacteria, bacterial vaginitis for which patient was given prescription for metronidazole at her most recent office visit.  Patient however left call requesting medication for treatment of bacterial vaginitis.  New prescription will be supplied but if she has continued symptoms, she will need office visit for further evaluation

## 2019-05-17 NOTE — Telephone Encounter (Signed)
I sent in another prescription for metronidazole to her pharmacy yesterday. Please check with her pharmacy and if not there then please give verbal order for metronidazole 500 mg twice per day for 7 days

## 2019-05-17 NOTE — Telephone Encounter (Signed)
She is still having symptoms.

## 2019-05-20 NOTE — Progress Notes (Signed)
Established Patient Office Visit  Subjective:  Patient ID: Lauren Terry, female    DOB: 14-Apr-1986  Age: 33 y.o. MRN: [id number]  CC:  Chief Complaint  Patient presents with  . Exposure to STD    HPI Lauren Terry presents for follow-up of asthma and patient with complaint of concern regarding exposure to sexually transmitted disease.  Patient reports that her asthma is currently controlled with the use of Symbicort but she requires a refill of this medication.  Patient states that her asthma seems to be triggered by changes in the weather, very hot or very cold weather as well as environmental factors such as pollen, smoke or strong odors.  She denies any current issues with nighttime awakening secondary to shortness of breath, cough or wheezing.  While on Symbicort, she does not have any avoidance of activity secondary to fear of asthma exacerbation.  She also has albuterol inhaler to use as needed.       Patient requests testing for sexually transmitted infections as she was recently involved in a sexual encounter with a new partner.  She denies any current pelvic or vaginal pain.  No vaginal itching or genital sores.  No current issues with vaginal discharge.  Past Medical History:  Diagnosis Date  . Asthma     History reviewed. No pertinent surgical history.  Family History  Problem Relation Age of Onset  . Diabetes Maternal Aunt   . Cancer Paternal Grandmother   . Breast cancer Paternal Grandmother     Social History   Socioeconomic History  . Marital status: Single    Spouse name: Not on file  . Number of children: Not on file  . Years of education: Not on file  . Highest education level: Not on file  Occupational History  . Not on file  Social Needs  . Financial resource strain: Not on file  . Food insecurity    Worry: Not on file    Inability: Not on file  . Transportation needs    Medical: Not on file    Non-medical: Not on file  Tobacco Use  .  Smoking status: Current Some Day Smoker    Packs/day: 0.01    Types: Cigarettes  . Smokeless tobacco: Never Used  Substance and Sexual Activity  . Alcohol use: Not Currently  . Drug use: Yes    Types: Marijuana  . Sexual activity: Yes  Lifestyle  . Physical activity    Days per week: Not on file    Minutes per session: Not on file  . Stress: Not on file  Relationships  . Social Herbalist on phone: Not on file    Gets together: Not on file    Attends religious service: Not on file    Active member of club or organization: Not on file    Attends meetings of clubs or organizations: Not on file    Relationship status: Not on file  . Intimate partner violence    Fear of current or ex partner: Not on file    Emotionally abused: Not on file    Physically abused: Not on file    Forced sexual activity: Not on file  Other Topics Concern  . Not on file  Social History Narrative   Patient is a single mother of 4 children ( 2 boys and 2 girls)    Outpatient Medications Prior to Visit  Medication Sig Dispense Refill  . acetaminophen (TYLENOL) 325 MG tablet Take 650  mg by mouth every 6 (six) hours as needed for mild pain or headache.    . albuterol (PROVENTIL HFA;VENTOLIN HFA) 108 (90 Base) MCG/ACT inhaler Inhale 2 puffs into the lungs every 6 (six) hours as needed for wheezing or shortness of breath. 1 Inhaler 4  . Pseudoeph-Doxylamine-DM-APAP (NYQUIL PO) Take 1 capsule by mouth at bedtime as needed (congestion/sleep).    . triamcinolone ointment (KENALOG) 0.5 % Apply 1 application topically 2 (two) times daily. (Patient taking differently: Apply 1 application topically 2 (two) times daily as needed (eczema). ) 30 g 6  . budesonide-formoterol (SYMBICORT) 80-4.5 MCG/ACT inhaler Inhale 2 puffs into the lungs 2 (two) times daily. 1 Inhaler 4  . famotidine (PEPCID) 20 MG tablet Take 1 tablet (20 mg total) by mouth 2 (two) times daily for 14 days. 28 tablet 0  . montelukast  (SINGULAIR) 10 MG tablet Take 1 tablet (10 mg total) by mouth at bedtime. (Patient not taking: Reported on 10/22/2018) 30 tablet 4  . metroNIDAZOLE (FLAGYL) 500 MG tablet Take 1 tablet (500 mg total) by mouth 2 (two) times daily. 14 tablet 0   No facility-administered medications prior to visit.     No Known Allergies  ROS Review of Systems  Constitutional: Negative for chills, fatigue and fever.  HENT: Positive for congestion (Allergic rhinitis; recurrent congestion). Negative for postnasal drip (No current nasal drainage but occurs at times), rhinorrhea, sneezing, sore throat and trouble swallowing.   Respiratory: Negative for cough, shortness of breath and wheezing.   Cardiovascular: Negative for chest pain and palpitations.  Gastrointestinal: Negative for abdominal pain, constipation, diarrhea and nausea.  Endocrine: Negative for cold intolerance, heat intolerance, polydipsia, polyphagia and polyuria.  Genitourinary: Negative for dysuria, frequency, genital sores, pelvic pain, vaginal discharge and vaginal pain.  Musculoskeletal: Negative for back pain and gait problem.  Neurological: Negative for dizziness and headaches.      Objective:    Physical Exam  Constitutional: She is oriented to person, place, and time. She appears well-developed and well-nourished. No distress.  Neck: Neck supple.  Cardiovascular: Normal rate and regular rhythm.  Pulmonary/Chest: Effort normal and breath sounds normal.  Abdominal: Soft. There is no abdominal tenderness. There is no rebound and no guarding.  Genitourinary:    Genitourinary Comments: No CVA tenderness   Musculoskeletal:        General: No tenderness or edema.  Lymphadenopathy:    She has no cervical adenopathy.  Neurological: She is alert and oriented to person, place, and time.  Skin: Skin is warm and dry.  Psychiatric: She has a normal mood and affect. Her behavior is normal.  Nursing note and vitals reviewed.   BP 128/79 (BP  Location: Left Arm, Patient Position: Sitting, Cuff Size: Large)   Pulse 83   Temp 99.1 F (37.3 C) (Oral)   Wt 178 lb (80.7 kg)   LMP 05/05/2019   BMI 31.53 kg/m  Wt Readings from Last 3 Encounters:  05/09/19 178 lb (80.7 kg)  11/12/18 178 lb 3.2 oz (80.8 kg)  05/28/18 181 lb 3.2 oz (82.2 kg)     Health Maintenance Due  Topic Date Due  . HIV Screening  11/11/2001  . TETANUS/TDAP  11/11/2005  . INFLUENZA VACCINE  04/13/2019    No results found for: TSH Lab Results  Component Value Date   WBC 9.8 10/22/2018   HGB 11.7 (L) 10/22/2018   HCT 38.6 10/22/2018   MCV 91.5 10/22/2018   PLT 326 10/22/2018   Lab Results  Component Value Date   NA 137 10/22/2018   K 3.7 10/22/2018   CO2 24 10/22/2018   GLUCOSE 98 10/22/2018   BUN 11 10/22/2018   CREATININE 0.75 10/22/2018   BILITOT 0.2 (L) 10/22/2018   ALKPHOS 83 10/22/2018   AST 24 10/22/2018   ALT 21 10/22/2018   PROT 7.2 10/22/2018   ALBUMIN 4.0 10/22/2018   CALCIUM 8.7 (L) 10/22/2018   ANIONGAP 7 10/22/2018   No results found for: CHOL No results found for: HDL No results found for: LDLCALC No results found for: TRIG No results found for: CHOLHDL No results found for: QQIW9N    Assessment & Plan:  1. Screen for STD (sexually transmitted disease) Patient request screening for sexually transmitted diseases due to possible exposure.  Patient will perform self swab for cervicovaginal ancillary.  Patient will be notified of the results and if any treatment is needed based on these results.  Orders for UA and urine cytology placed by CMA. - Cervicovaginal ancillary only - Urine cytology ancillary only - POCT URINALYSIS DIP (CLINITEK)  2. Moderate persistent asthma without complication Patient with moderate persistent asthma which is currently controlled with daily use of Symbicort, Singulair and as needed use of albuterol.  New prescription provided for Symbicort refill and patient states that she still has  prescriptions for the other medications.  She is aware of the need to try and avoid known triggers.  She was also encouraged to have influenza immunization this fall.  The need for complete smoking cessation was also reviewed/discussed with the patient.   Meds ordered this encounter  Medications  . budesonide-formoterol (SYMBICORT) 80-4.5 MCG/ACT inhaler    Sig: Inhale 2 puffs into the lungs 2 (two) times daily.    Dispense:  1 Inhaler    Refill:  4   An After Visit Summary was printed and given to the patient.  Follow-up: Return if symptoms worsen or fail to improve, for asthma- 4-5 months.    Cain Saupe, MD

## 2019-05-24 NOTE — Telephone Encounter (Signed)
LMOM

## 2019-05-28 NOTE — Telephone Encounter (Signed)
LMOM

## 2019-09-09 ENCOUNTER — Ambulatory Visit: Payer: 59 | Admitting: Family Medicine

## 2019-09-25 ENCOUNTER — Ambulatory Visit: Payer: 59 | Admitting: Family Medicine

## 2019-12-18 ENCOUNTER — Telehealth: Payer: Self-pay | Admitting: Family Medicine

## 2019-12-18 NOTE — Telephone Encounter (Signed)
Pt needs std testing can I add her for friady

## 2019-12-20 ENCOUNTER — Ambulatory Visit (HOSPITAL_BASED_OUTPATIENT_CLINIC_OR_DEPARTMENT_OTHER): Payer: 59 | Admitting: Family

## 2019-12-20 ENCOUNTER — Encounter: Payer: Self-pay | Admitting: Family

## 2019-12-20 ENCOUNTER — Other Ambulatory Visit (HOSPITAL_COMMUNITY)
Admission: RE | Admit: 2019-12-20 | Discharge: 2019-12-20 | Disposition: A | Discharge status: Home / Self Care | Payer: 59 | Source: Ambulatory Visit | Attending: Family | Admitting: Family

## 2019-12-20 ENCOUNTER — Other Ambulatory Visit: Payer: Self-pay

## 2019-12-20 VITALS — BP 134/85 | HR 71 | Temp 97.5°F | Ht 63.0 in | Wt 174.8 lb

## 2019-12-20 DIAGNOSIS — Z113 Encounter for screening for infections with a predominantly sexual mode of transmission: Secondary | ICD-10-CM | POA: Insufficient documentation

## 2019-12-20 MED ORDER — VALACYCLOVIR HCL 1 G PO TABS: 2000.0000 mg | ORAL_TABLET | Freq: Two times a day (BID) | ORAL | 0 refills | Status: DC

## 2019-12-20 NOTE — Progress Notes (Signed)
Wants to get tested for all std  Refill for inhaler and kenalog

## 2019-12-20 NOTE — Patient Instructions (Addendum)
STI testing. Valacyclovir for cold sore.  Cold Sore  A cold sore, also called a fever blister, is a small, fluid-filled sore that forms inside of the mouth or on the lips, gums, nose, chin, or cheeks. Cold sores can spread to other parts of the body, such as the eyes or fingers. Cold sores can spread from person to person (are contagious) until the sores crust over completely. Most cold sores go away within 2 weeks. What are the causes? Cold sores are caused by a virus (herpes simplex virus type 1, HSV-1). The virus can spread from person to person through close contact, such as through:  Kissing.  Touching the affected area.  Sharing personal items such as lip balm, razors, a drinking glass, or eating utensils. What increases the risk? You are more likely to develop this condition if you:  Are tired, stressed, or sick.  Are having your period (menstruating).  Are pregnant.  Take certain medicines.  Are out in cold weather or get too much sun. What are the signs or symptoms? Symptoms of a cold sore outbreak go through different stages. These are the stages of a cold sore:  Tingling, itching, or burning is felt 1-2 days before the outbreak.  Fluid-filled blisters appear on the lips, inside the mouth, on the nose, or on the cheeks.  The blisters start to ooze clear fluid.  The blisters dry up, and a yellow crust appears in their place.  The crust falls off. In some cases, other symptoms can develop during a cold sore outbreak. These can include:  Fever.  Sore throat.  Headache.  Muscle aches.  Swollen neck glands. How is this treated? There is no cure for cold sores or the virus that causes them. There is also no vaccine to prevent the virus. Most cold sores go away on their own without treatment within 2 weeks. Your doctor may prescribe medicines to:  Help with pain.  Keep the virus from growing.  Help you heal faster. Medicines may be in the form of creams,  gels, pills, or a shot. Follow these instructions at home: Medicines  Take or apply over-the-counter and prescription medicines only as told by your doctor.  Use a cotton-tip swab to apply creams or gels to your sores.  Ask your doctor if you can take lysine supplements. These may help with healing. Sore care   Do not touch the sores or pick the scabs.  Wash your hands often. Do not touch your eyes without washing your hands first.  Keep the sores clean and dry.  If told, put ice on the sores: ? Put ice in a plastic bag. ? Place a towel between your skin and the bag. ? Leave the ice on for 20 minutes, 2-3 times a day. Eating and drinking  Eat a soft, bland diet. Avoid eating hot, cold, or salty foods. These can hurt your mouth.  Use a straw if it hurts to drink out of a glass.  Eat foods that have a lot of lysine in them. These include meat, fish, and dairy products.  Avoid sugary foods, chocolates, nuts, and grains. These foods have a high amount of a substance (arginine) that can cause the virus to grow. Lifestyle  Do not kiss, have oral sex, or share personal items until your sores heal.  Stress, poor sleep, and being out in the sun can trigger outbreaks. Make sure you: ? Do activities that help you relax, such as deep breathing exercises or meditation. ?  Get enough sleep. ? Apply sunscreen on your lips before you go out in the sun. Contact a doctor if:  You have symptoms for more than 2 weeks.  You have pus coming from the sores.  You have redness that is spreading.  You have pain or irritation in your eye.  You get sores on your genitals.  Your sores do not heal within 2 weeks.  You get cold sores often. Get help right away if:  You have a fever and your symptoms suddenly get worse.  You have a headache and confusion.  You have tiredness (fatigue).  You do not want to eat as much as normal (loss of appetite).  You have a stiff neck or are sensitive  to light. Summary  A cold sore is a small, fluid-filled sore that forms inside of the mouth or on the lips, gums, nose, chin, or cheeks.  Cold sores can spread from person to person (are contagious) until the sores crust over completely. Most cold sores go away within 2 weeks.  Wash your hands often. Do not touch your eyes without washing your hands first.  Do not kiss, have oral sex, or share personal items until your sores heal.  Contact a doctor if your sores do not heal within 2 weeks. This information is not intended to replace advice given to you by your health care provider. Make sure you discuss any questions you have with your health care provider. Document Revised: 12/19/2018 Document Reviewed: 01/29/2018 Elsevier Patient Education  [place].

## 2019-12-20 NOTE — Progress Notes (Signed)
Patient ID: Lauren Terry, female    DOB: 19-Nov-1985  MRN: [id number]  CC: Sexually Transmitted Infection  Subjective: Lauren Terry is a 34 y.o. female with history of moderate persistent asthma.  Her concerns today include: STI  1. Sexually Transmitted Infection Testing:  LMP: 11/20/2019 Menstruation Flow: Light []  Medium [x]  Heavy []  Menstruation Length: 6 days Menstruation Frequency: monthly Amenorrhea: Yes []  No [x]  Dysmenorrhea:denies  Breakthrough Bleeding: denies    Vaginal Discharge: denies Currently sexually active: Yes, with 1 female partner  Do you use protection such as birth control or condoms: denies Pain with intercourse: denies Pelvic pain: denies Burning with urination: denies Partners: 1 female Would you like HIV testing: Yes   Comments: Reports 4 days ago started feeling a weird sensation above the right side of lip. Reports the next day right side of lip started swelling and appeared as a large bump. States the following day she went to [facility]. Reports the provider there scraped the bump to send off for analysis and prescribed her Valacyclovir. States the results of the scrape that the provider collected have not returned. Reports Valacyclovir helped and the swelling has decreased since then. States she completed the 2 day course of medication. Reports has been putting Carmex on the bump to help soothe and it helps some. Used Orajel Cold Sore which didn't help much. Endorses that the bump is painful rating 4/10. Denies any lesions or bumps elsewhere on the body. States that she would like to be tested for all STIs to make sure that she is safe. Denies previous history of STIs.  Patient Active Problem List   Diagnosis Date Noted  . Moderate persistent asthma 05/30/2018     Current Outpatient Medications on File Prior to Visit  Medication Sig Dispense Refill  . acetaminophen (TYLENOL) 325 MG tablet Take 650 mg by mouth every 6 (six) hours as needed  for mild pain or headache.    . albuterol (PROVENTIL HFA;VENTOLIN HFA) 108 (90 Base) MCG/ACT inhaler Inhale 2 puffs into the lungs every 6 (six) hours as needed for wheezing or shortness of breath. 1 Inhaler 4  . budesonide-formoterol (SYMBICORT) 80-4.5 MCG/ACT inhaler Inhale 2 puffs into the lungs 2 (two) times daily. 1 Inhaler 4  . famotidine (PEPCID) 20 MG tablet Take 1 tablet (20 mg total) by mouth 2 (two) times daily for 14 days. 28 tablet 0  . metroNIDAZOLE (FLAGYL) 500 MG tablet Take 1 tablet (500 mg total) by mouth 2 (two) times daily. 14 tablet 0  . montelukast (SINGULAIR) 10 MG tablet Take 1 tablet (10 mg total) by mouth at bedtime. (Patient not taking: Reported on 10/22/2018) 30 tablet 4  . Pseudoeph-Doxylamine-DM-APAP (NYQUIL PO) Take 1 capsule by mouth at bedtime as needed (congestion/sleep).    . triamcinolone ointment (KENALOG) 0.5 % Apply 1 application topically 2 (two) times daily. (Patient taking differently: Apply 1 application topically 2 (two) times daily as needed (eczema). ) 30 g 6   No current facility-administered medications on file prior to visit.    No Known Allergies  Social History   Socioeconomic History  . Marital status: Single    Spouse name: Not on file  . Number of children: Not on file  . Years of education: Not on file  . Highest education level: Not on file  Occupational History  . Not on file  Tobacco Use  . Smoking status: Current Some Day Smoker    Packs/day: 0.01    Types: Cigarettes  .  Smokeless tobacco: Never Used  Substance and Sexual Activity  . Alcohol use: Not Currently  . Drug use: Yes    Types: Marijuana  . Sexual activity: Yes  Other Topics Concern  . Not on file  Social History Narrative   Patient is a single mother of 4 children ( 2 boys and 2 girls)   Social Determinants of Health   Financial Resource Strain:   . Difficulty of Paying Living Expenses:   Food Insecurity:   . Worried About Programme researcher, broadcasting/film/video in the Last  Year:   . Barista in the Last Year:   Transportation Needs:   . Freight forwarder (Medical):   Marland Kitchen Lack of Transportation (Non-Medical):   Physical Activity:   . Days of Exercise per Week:   . Minutes of Exercise per Session:   Stress:   . Feeling of Stress :   Social Connections:   . Frequency of Communication with Friends and Family:   . Frequency of Social Gatherings with Friends and Family:   . Attends Religious Services:   . Active Member of Clubs or Organizations:   . Attends Banker Meetings:   Marland Kitchen Marital Status:   Intimate Partner Violence:   . Fear of Current or Ex-Partner:   . Emotionally Abused:   Marland Kitchen Physically Abused:   . Sexually Abused:     Family History  Problem Relation Age of Onset  . Diabetes Maternal Aunt   . Cancer Paternal Grandmother   . Breast cancer Paternal Grandmother     No past surgical history on file.  ROS: Review of Systems Negative except as stated above  PHYSICAL EXAM: Vitals with BMI 12/20/2019 05/09/2019 11/12/2018  Height 5\' 3"  - 5\' 3"   Weight 174 lbs 13 oz 178 lbs 178 lbs 3 oz  BMI 30.97 - 31.57  Systolic 134 128  Diastolic 85 79 64  Pulse 71 83 73  Temperature- 97.5 F, oral  Physical Exam General appearance - alert, well appearing, and in no distress and crying Mental status - alert, oriented to person, place, and time, normal mood, behavior, speech, dress, motor activity, and thought processes Chest - clear to auscultation, no wheezes, rales or rhonchi, symmetric air entry, no tachypnea, retractions or cyanosis Heart - normal rate, regular rhythm, normal S1, S2, no murmurs, rubs, clicks or gallops Skin - normal coloration and turgor, red raised lesion the size of a dime on right upper lip and no drainage present  ASSESSMENT AND PLAN: 1. Routine screening for STI (sexually transmitted infection): -STI screening collected during today's visit. Will notify patient of management plan once results are  processed.   -Will await results from [facility] as the provider there sent a sample of the cold sore for diagnostics. 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 Will give 2 day course of Valacyclovir to assist with decreasing symptoms and flare of possible outbreak.  -Counseled patient that Valacyclovir does not not cure possible herpes simplex 1 virus. -Patient should follow-up with primary doctor if symptoms do not improve or worsen. - Cervicovaginal ancillary only - valACYclovir (VALTREX) 1000 MG tablet; Take 2 tablets (2,000 mg total) by mouth 2 (two) times daily.  Dispense: 8 tablet; Refill: 0 - HIV antibody (with reflex)   Patient was given the opportunity to ask questions.  Patient verbalized understanding of the plan and was able to repeat key elements of the plan. Patient  was given clear instructions to go to Emergency Department or return to medical center if symptoms don't improve, worsen, or new problems develop.The patient verbalized understanding.  Requested Prescriptions    No prescriptions requested or ordered in this encounter    [place] Troia Zachery Dauer, NP

## 2019-12-21 LAB — HIV ANTIBODY (ROUTINE TESTING W REFLEX): HIV Screen 4th Generation wRfx: NONREACTIVE

## 2019-12-21 NOTE — Progress Notes (Signed)
HIV results negative.   Remaining lab pending.

## 2019-12-23 LAB — CERVICOVAGINAL ANCILLARY ONLY
Bacterial Vaginitis (gardnerella): NEGATIVE
Candida Glabrata: NEGATIVE
Candida Vaginitis: NEGATIVE
Chlamydia: NEGATIVE
Comment: NEGATIVE
Comment: NEGATIVE
Comment: NEGATIVE
Comment: NEGATIVE
Comment: NEGATIVE
Comment: NORMAL
Neisseria Gonorrhea: NEGATIVE
Trichomonas: NEGATIVE

## 2019-12-23 NOTE — Progress Notes (Signed)
Discussed negative results of both cervicovaginal ancillary and HIV antibody with patient. Patient states she understands.

## 2019-12-30 ENCOUNTER — Ambulatory Visit: Payer: 59 | Admitting: Family

## 2020-05-11 ENCOUNTER — Other Ambulatory Visit: Payer: Self-pay | Admitting: Family Medicine

## 2020-05-11 NOTE — Telephone Encounter (Signed)
Requested Prescriptions  Pending Prescriptions Disp Refills   budesonide-formoterol (SYMBICORT) 80-4.5 MCG/ACT inhaler [Pharmacy Med Name: BUDESONIDE/FORM 80/4. (120 INH)] 10.2 g 0    Sig: INHALE 2 PUFFS INTO THE LUNGS TWICE DAILY     Pulmonology:  Combination Products Passed - 05/11/2020  4:22 PM      Passed - Valid encounter within last 12 months    Recent Outpatient Visits          4 months ago Routine screening for STI (sexually transmitted infection)   [facility] [place], [place], NP   1 year ago Moderate persistent asthma without complication   [facility] [place], [place], MD   1 year ago Bacterial vaginosis   [facility] [place], Odette [place], MD   1 year ago Screening for cervical cancer   [facility] [place], [place], MD   2 years ago Moderate persistent asthma, unspecified whether complicated   [facility] [place], [place], MD             attempted to contact patient but was unable to leave VM. Person Identified on VM was not patient. Last OV for Asthma 05/09/19. Courtesy refill given

## 2020-05-15 ENCOUNTER — Telehealth: Payer: Self-pay | Admitting: Family

## 2020-05-15 ENCOUNTER — Ambulatory Visit: Payer: Self-pay | Attending: Nurse Practitioner | Admitting: Nurse Practitioner

## 2020-05-15 DIAGNOSIS — U071 COVID-19: Secondary | ICD-10-CM

## 2020-05-15 MED ORDER — BUDESONIDE-FORMOTEROL FUMARATE 80-4.5 MCG/ACT IN AERO: 2.0000 | INHALATION_SPRAY | Freq: Two times a day (BID) | RESPIRATORY_TRACT | 0 refills | Status: DC

## 2020-05-15 MED ORDER — ALBUTEROL SULFATE HFA 108 (90 BASE) MCG/ACT IN AERS: 2.0000 | INHALATION_SPRAY | Freq: Four times a day (QID) | RESPIRATORY_TRACT | 1 refills | Status: DC | PRN

## 2020-05-15 MED ORDER — ALBUTEROL SULFATE (2.5 MG/3ML) 0.083% IN NEBU: 2.5000 mg | INHALATION_SOLUTION | Freq: Four times a day (QID) | RESPIRATORY_TRACT | 3 refills | Status: DC | PRN

## 2020-05-15 NOTE — Telephone Encounter (Signed)
Phone call to pt. To discuss her medication needs.  Noted that she requested the Ipratropium-Albuterol Nebulizer solution.  Questioned pt. If she has taken this in the past.  Stated she has a breathing machine and has used nebulizer solution that her relatives have shared with her.  Stated she was diagnosed with COVID today.  C/o headache, stuffy nose, and loss of taste or smell.  Advised she would need a virtual visit to be able to discuss need for nebulizer medication.  Scheduled a MyChart VV at 3:10 PM today.  Pt. Also stated she needs refill on Symbicort Inhaler.  Advised will order the Inhaler for her at this time.  Pt. Verb. Understanding.  Agreed with plan.

## 2020-05-17 ENCOUNTER — Encounter: Payer: Self-pay | Admitting: Nurse Practitioner

## 2020-05-17 NOTE — Progress Notes (Addendum)
Assessment & Plan:  Lauren Terry was seen today for covid positive.  Diagnoses and all orders for this visit:  COVID-19 -     albuterol (PROVENTIL) (2.5 MG/3ML) 0.083% nebulizer solution; Take 3 mLs (2.5 mg total) by nebulization every 6 (six) hours as needed for wheezing or shortness of breath. -     albuterol (VENTOLIN HFA) 108 (90 Base) MCG/ACT inhaler; Inhale 2 puffs into the lungs every 6 (six) hours as needed for wheezing or shortness of breath.    Patient has been counseled on age-appropriate routine health concerns for screening and prevention. These are reviewed and up-to-date. Referrals have been placed accordingly. Immunizations are up-to-date or declined.    Subjective:   Chief Complaint  Patient presents with  . Covid Positive   HPI Lauren Terry 34 y.o. female presents for video visit requesting Nebulizers and refill of her albuterol inhaler as she was just diagnosed with COVID along with her children. She is feeling okay. Denies chest pain, worsening shortness of breath or any respiratory distress.  She is quarantining at this time. She has not been vaccinated. She is a smoker and also has a history of asthma.   Virtual Visit via [facility] VIDEO CONSENT  Due to national recommendations of social distancing due to COVID 19, video visit is felt to be most appropriate for this patient at this time.  I discussed the limitations, risks, security and privacy concerns of performing an evaluation and management service by video and the availability of in person appointments. I also discussed with the patient that there may be a patient responsible charge related to this service. The patient expressed understanding and agreed to proceed.    I connected with Patriciaann Clan on 05/18/20  at   3:10 PM EDT  EDT via mychart video visit and verified that I am speaking with the correct person using two identifiers.  Location of Patient: Private Residence   [facility]  Provider[facility]    Persons participating in Telemedicine visit: Bertram Denver FNP-BC YY La Carla CMA Yury Haacke  Review of Systems  Constitutional: Negative for fever, malaise/fatigue and weight loss.  HENT: Negative.  Negative for nosebleeds.   Eyes: Negative.  Negative for blurred vision, double vision and photophobia.  Respiratory: Positive for cough. Negative for shortness of breath.   Cardiovascular: Negative.  Negative for chest pain, palpitations and leg swelling.  Gastrointestinal: Negative.  Negative for heartburn, nausea and vomiting.  Musculoskeletal: Negative.  Negative for myalgias.  Neurological: Negative.  Negative for dizziness, focal weakness, seizures and headaches.  Psychiatric/Behavioral: Negative.  Negative for suicidal ideas.    Past Medical History:  Diagnosis Date  . Asthma     No past surgical history on file.  Family History  Problem Relation Age of Onset  . Diabetes Maternal Aunt   . Cancer Paternal Grandmother   . Breast cancer Paternal Grandmother     Social History Reviewed with no changes to be made today.   Outpatient Medications Prior to Visit  Medication Sig Dispense Refill  . acetaminophen (TYLENOL) 325 MG tablet Take 650 mg by mouth every 6 (six) hours as needed for mild pain or headache.    . budesonide-formoterol (SYMBICORT) 80-4.5 MCG/ACT inhaler Inhale 2 puffs into the lungs 2 (two) times daily. 10.2 g 0  . famotidine (PEPCID) 20 MG tablet Take 1 tablet (20 mg total) by mouth 2 (two) times daily for 14 days. (Patient not taking: Reported on 12/20/2019) 28 tablet 0  .  metroNIDAZOLE (FLAGYL) 500 MG tablet Take 1 tablet (500 mg total) by mouth 2 (two) times daily. (Patient not taking: Reported on 12/20/2019) 14 tablet 0  . montelukast (SINGULAIR) 10 MG tablet Take 1 tablet (10 mg total) by mouth at bedtime. 30 tablet 4  . Pseudoeph-Doxylamine-DM-APAP (NYQUIL PO) Take 1 capsule by mouth at bedtime as  needed (congestion/sleep).    . triamcinolone ointment (KENALOG) 0.5 % Apply 1 application topically 2 (two) times daily. (Patient taking differently: Apply 1 application topically 2 (two) times daily as needed (eczema). ) 30 g 6  . valACYclovir (VALTREX) 1000 MG tablet Take 2 tablets (2,000 mg total) by mouth 2 (two) times daily. 8 tablet 0  . albuterol (PROVENTIL HFA;VENTOLIN HFA) 108 (90 Base) MCG/ACT inhaler Inhale 2 puffs into the lungs every 6 (six) hours as needed for wheezing or shortness of breath. 1 Inhaler 4   No facility-administered medications prior to visit.    No Known Allergies     Objective:    There were no vitals taken for this visit. Wt Readings from Last 3 Encounters:  12/20/19 174 lb 12.8 oz (79.3 kg)  05/09/19 178 lb (80.7 kg)  11/12/18 178 lb 3.2 oz (80.8 kg)    Physical Exam Constitutional:      Appearance: Normal appearance.  HENT:     Head: Normocephalic.  Pulmonary:     Effort: Pulmonary effort is normal.  Neurological:     Mental Status: She is alert and oriented to person, place, and time.          Patient has been counseled extensively about nutrition and exercise as well as the importance of adherence with medications and regular follow-up. The patient was given clear instructions to go to ER or return to medical center if symptoms don't improve, worsen or new problems develop. The patient verbalized understanding.   Follow-up: Return if symptoms worsen or fail to improve.   Claiborne Rigg, FNP-BC [facility] [place], [place] [phone]

## 2020-06-08 ENCOUNTER — Ambulatory Visit: Payer: Medicaid Other | Admitting: Nurse Practitioner

## 2020-07-14 ENCOUNTER — Other Ambulatory Visit: Payer: Self-pay

## 2020-07-14 ENCOUNTER — Ambulatory Visit: Payer: Self-pay | Attending: Nurse Practitioner | Admitting: Family Medicine

## 2020-07-14 ENCOUNTER — Encounter: Payer: Self-pay | Admitting: Family Medicine

## 2020-07-14 VITALS — BP 111/70 | HR 72 | Ht 63.0 in | Wt 185.0 lb

## 2020-07-14 DIAGNOSIS — Z202 Contact with and (suspected) exposure to infections with a predominantly sexual mode of transmission: Secondary | ICD-10-CM

## 2020-07-14 DIAGNOSIS — N898 Other specified noninflammatory disorders of vagina: Secondary | ICD-10-CM

## 2020-07-14 DIAGNOSIS — N3001 Acute cystitis with hematuria: Secondary | ICD-10-CM

## 2020-07-14 LAB — POCT URINALYSIS DIP (CLINITEK)
Bilirubin, UA: NEGATIVE
Glucose, UA: NEGATIVE mg/dL
Ketones, POC UA: NEGATIVE mg/dL
Nitrite, UA: NEGATIVE
POC PROTEIN,UA: NEGATIVE
Spec Grav, UA: 1.02 (ref 1.010–1.025)
Urobilinogen, UA: 0.2 E.U./dL
pH, UA: 7 (ref 5.0–8.0)

## 2020-07-14 MED ORDER — CEPHALEXIN 500 MG PO CAPS: 500.0000 mg | ORAL_CAPSULE | Freq: Two times a day (BID) | ORAL | 0 refills | Status: DC

## 2020-07-14 MED ORDER — AZITHROMYCIN 250 MG PO TABS: 1000.0000 mg | ORAL_TABLET | Freq: Once | ORAL | 0 refills | Status: AC

## 2020-07-14 MED ORDER — FLUCONAZOLE 150 MG PO TABS: 150.0000 mg | ORAL_TABLET | Freq: Once | ORAL | 0 refills | Status: AC

## 2020-07-14 NOTE — Patient Instructions (Signed)
Vaginal Yeast Infection, Adult  Vaginal yeast infection is a condition that causes vaginal discharge as well as soreness, swelling, and redness (inflammation) of the vagina. This is a common condition. Some women get this infection frequently. What are the causes? This condition is caused by a change in the normal balance of the yeast (candida) and bacteria that live in the vagina. This change causes an overgrowth of yeast, which causes the inflammation. What increases the risk? The condition is more likely to develop in women who:  Take antibiotic medicines.  Have diabetes.  Take birth control pills.  Are pregnant.  Douche often.  Have a weak body defense system (immune system).  Have been taking steroid medicines for a long time.  Frequently wear tight clothing. What are the signs or symptoms? Symptoms of this condition include:  White, thick, creamy vaginal discharge.  Swelling, itching, redness, and irritation of the vagina. The lips of the vagina (vulva) may be affected as well.  Pain or a burning feeling while urinating.  Pain during sex. How is this diagnosed? This condition is diagnosed based on:  Your medical history.  A physical exam.  A pelvic exam. Your health care provider will examine a sample of your vaginal discharge under a microscope. Your health care provider may send this sample for testing to confirm the diagnosis. How is this treated? This condition is treated with medicine. Medicines may be over-the-counter or prescription. You may be told to use one or more of the following:  Medicine that is taken by mouth (orally).  Medicine that is applied as a cream (topically).  Medicine that is inserted directly into the vagina (suppository). Follow these instructions at home:  Lifestyle  Do not have sex until your health care provider approves. Tell your sex partner that you have a yeast infection. That person should go to his or her health care  provider and ask if they should also be treated.  Do not wear tight clothes, such as pantyhose or tight pants.  Wear breathable cotton underwear. General instructions  Take or apply over-the-counter and prescription medicines only as told by your health care provider.  Eat more yogurt. This may help to keep your yeast infection from returning.  Do not use tampons until your health care provider approves.  Try taking a sitz bath to help with discomfort. This is a warm water bath that is taken while you are sitting down. The water should only come up to your hips and should cover your buttocks. Do this 3-4 times per day or as told by your health care provider.  Do not douche.  If you have diabetes, keep your blood sugar levels under control.  Keep all follow-up visits as told by your health care provider. This is important. Contact a health care provider if:  You have a fever.  Your symptoms go away and then return.  Your symptoms do not get better with treatment.  Your symptoms get worse.  You have new symptoms.  You develop blisters in or around your vagina.  You have blood coming from your vagina and it is not your menstrual period.  You develop pain in your abdomen. Summary  Vaginal yeast infection is a condition that causes discharge as well as soreness, swelling, and redness (inflammation) of the vagina.  This condition is treated with medicine. Medicines may be over-the-counter or prescription.  Take or apply over-the-counter and prescription medicines only as told by your health care provider.  Do not douche.   Do not have sex or use tampons until your health care provider approves.  Contact a health care provider if your symptoms do not get better with treatment or your symptoms go away and then return. This information is not intended to replace advice given to you by your health care provider. Make sure you discuss any questions you have with your health care  provider. Document Revised: 03/29/2019 Document Reviewed: 01/15/2018 Elsevier Patient Education  2020 [employer]  

## 2020-07-14 NOTE — Progress Notes (Signed)
Subjective:  Patient ID: Lauren Terry, female    DOB: September 08, 1986  Age: 34 y.o. MRN: [id number]  CC: Vaginal Discharge   HPI Lauren Terry is a 34 year old female patient of Dr. Follow-up who presents today for an acute visit. Complains of dysuria x1.5 weeks, frequency but no flank pain. Also has had vaginal itching and thick whitish vaginal discharge Partner was treated for Chlamydia 1 month ago. She has not used any OTC medications for her symptoms. Denies presence of abdominal pain or fever. Past Medical History:  Diagnosis Date  . Asthma     No past surgical history on file.  Family History  Problem Relation Age of Onset  . Diabetes Maternal Aunt   . Cancer Paternal Grandmother   . Breast cancer Paternal Grandmother     No Known Allergies  Outpatient Medications Prior to Visit  Medication Sig Dispense Refill  . acetaminophen (TYLENOL) 325 MG tablet Take 650 mg by mouth every 6 (six) hours as needed for mild pain or headache.    . albuterol (PROVENTIL) (2.5 MG/3ML) 0.083% nebulizer solution Take 3 mLs (2.5 mg total) by nebulization every 6 (six) hours as needed for wheezing or shortness of breath. 150 mL 3  . albuterol (VENTOLIN HFA) 108 (90 Base) MCG/ACT inhaler Inhale 2 puffs into the lungs every 6 (six) hours as needed for wheezing or shortness of breath. 18 g 1  . budesonide-formoterol (SYMBICORT) 80-4.5 MCG/ACT inhaler Inhale 2 puffs into the lungs 2 (two) times daily. (Patient not taking: Reported on 07/14/2020) 10.2 g 0  . famotidine (PEPCID) 20 MG tablet Take 1 tablet (20 mg total) by mouth 2 (two) times daily for 14 days. (Patient not taking: Reported on 12/20/2019) 28 tablet 0  . metroNIDAZOLE (FLAGYL) 500 MG tablet Take 1 tablet (500 mg total) by mouth 2 (two) times daily. (Patient not taking: Reported on 12/20/2019) 14 tablet 0  . montelukast (SINGULAIR) 10 MG tablet Take 1 tablet (10 mg total) by mouth at bedtime. (Patient not taking: Reported on 07/14/2020) 30  tablet 4  . Pseudoeph-Doxylamine-DM-APAP (NYQUIL PO) Take 1 capsule by mouth at bedtime as needed (congestion/sleep). (Patient not taking: Reported on 07/14/2020)    . triamcinolone ointment (KENALOG) 0.5 % Apply 1 application topically 2 (two) times daily. (Patient not taking: Reported on 07/14/2020) 30 g 6  . valACYclovir (VALTREX) 1000 MG tablet Take 2 tablets (2,000 mg total) by mouth 2 (two) times daily. (Patient not taking: Reported on 07/14/2020) 8 tablet 0   No facility-administered medications prior to visit.     ROS Review of Systems  Constitutional: Negative for activity change, appetite change and fatigue.  HENT: Negative for congestion, sinus pressure and sore throat.   Eyes: Negative for visual disturbance.  Respiratory: Negative for cough, chest tightness, shortness of breath and wheezing.   Cardiovascular: Negative for chest pain and palpitations.  Gastrointestinal: Negative for abdominal distention, abdominal pain and constipation.  Endocrine: Negative for polydipsia.  Genitourinary: Positive for dysuria, frequency and vaginal discharge.  Musculoskeletal: Negative for arthralgias and back pain.  Skin: Negative for rash.  Neurological: Negative for tremors, light-headedness and numbness.  Hematological: Does not bruise/bleed easily.  Psychiatric/Behavioral: Negative for agitation and behavioral problems.    Objective:  BP 111/70   Pulse 72   Ht 5\' 3"  (1.6 m)   Wt 185 lb (83.9 kg)   SpO2 100%   BMI 32.77 kg/m   BP/Weight 07/14/2020 12/20/2019 05/09/2019  Systolic BP 111 134 128  Diastolic BP 70  85 79  Wt. (Lbs) 185 174.8 178  BMI 32.77 30.96 31.53      Physical Exam Constitutional:      Appearance: She is well-developed.  Neck:     Vascular: No JVD.  Cardiovascular:     Rate and Rhythm: Normal rate.     Heart sounds: Normal heart sounds. No murmur heard.   Pulmonary:     Effort: Pulmonary effort is normal.     Breath sounds: Normal breath sounds. No  wheezing or rales.  Chest:     Chest wall: No tenderness.  Abdominal:     General: Bowel sounds are normal. There is no distension.     Palpations: Abdomen is soft. There is no mass.     Tenderness: There is no abdominal tenderness. There is no right CVA tenderness or left CVA tenderness.  Musculoskeletal:        General: Normal range of motion.     Right lower leg: No edema.     Left lower leg: No edema.  Neurological:     Mental Status: She is alert and oriented to person, place, and time.  Psychiatric:        Mood and Affect: Mood normal.     CMP Latest Ref Rng & Units 10/22/2018  Glucose 70 - 99 mg/dL 98  BUN 6 - 20 mg/dL 11  Creatinine 4.27 - 0.62 mg/dL 3.76  Sodium 283 - 151 mmol/L 137  Potassium 3.5 - 5.1 mmol/L 3.7  Chloride 98 - 111 mmol/L 106  CO2 22 - 32 mmol/L 24  Calcium 8.9 - 10.3 mg/dL 7.6(H)  Total Protein 6.5 - 8.1 g/dL 7.2  Total Bilirubin 0.3 - 1.2 mg/dL 6.0(V)  Alkaline Phos 38 - 126 U/L 83  AST 15 - 41 U/L 24  ALT 0 - 44 U/L 21    Lipid Panel  No results found for: CHOL, TRIG, HDL, CHOLHDL, VLDL, LDLCALC, LDLDIRECT  CBC    Component Value Date/Time   WBC 9.8 10/22/2018 0737   RBC 4.22 10/22/2018 0737   HGB 11.7 (L) 10/22/2018 0737   HCT 38.6 10/22/2018 0737   PLT 326 10/22/2018 0737   MCV 91.5 10/22/2018 0737   MCH 27.7 10/22/2018 0737   MCHC 30.3 10/22/2018 0737   RDW 14.8 10/22/2018 0737    No results found for: HGBA1C  Assessment & Plan:  1. Vaginal discharge - fluconazole (DIFLUCAN) 150 MG tablet; Take 1 tablet (150 mg total) by mouth once for 1 dose.  Dispense: 1 tablet; Refill: 0 - Cervicovaginal ancillary only  2. Acute cystitis with hematuria - POCT URINALYSIS DIP (CLINITEK) - cephALEXin (KEFLEX) 500 MG capsule; Take 1 capsule (500 mg total) by mouth 2 (two) times daily.  Dispense: 10 capsule; Refill: 0  3. Exposure to chlamydia Partner was recently diagnosed with chlamydia - azithromycin (ZITHROMAX) 250 MG tablet; Take 4  tablets (1,000 mg total) by mouth once for 1 dose.  Dispense: 4 tablet; Refill: 0   Meds ordered this encounter  Medications  . azithromycin (ZITHROMAX) 250 MG tablet    Sig: Take 4 tablets (1,000 mg total) by mouth once for 1 dose.    Dispense:  4 tablet    Refill:  0  . fluconazole (DIFLUCAN) 150 MG tablet    Sig: Take 1 tablet (150 mg total) by mouth once for 1 dose.    Dispense:  1 tablet    Refill:  0  . cephALEXin (KEFLEX) 500 MG capsule    Sig:  Take 1 capsule (500 mg total) by mouth 2 (two) times daily.    Dispense:  10 capsule    Refill:  0    Follow-up: No follow-ups on file.       Hoy Register, MD, FAAFP. [facility] [place], [place] [phone]   07/14/2020, 5:10 PM

## 2020-07-14 NOTE — Progress Notes (Signed)
Having vaginal discharge, partners was treated for chlamydia.  Burning urination and vagina itching.

## 2020-07-16 ENCOUNTER — Other Ambulatory Visit: Payer: Self-pay | Admitting: Family Medicine

## 2020-07-16 DIAGNOSIS — B9689 Other specified bacterial agents as the cause of diseases classified elsewhere: Secondary | ICD-10-CM

## 2020-07-16 LAB — CERVICOVAGINAL ANCILLARY ONLY
Bacterial Vaginitis (gardnerella): NEGATIVE
Candida Glabrata: NEGATIVE
Candida Vaginitis: NEGATIVE
Chlamydia: NEGATIVE
Comment: NEGATIVE
Comment: NEGATIVE
Comment: NEGATIVE
Comment: NEGATIVE
Comment: NEGATIVE
Comment: NORMAL
Neisseria Gonorrhea: NEGATIVE
Trichomonas: POSITIVE — AB

## 2020-07-16 MED ORDER — METRONIDAZOLE 500 MG PO TABS: 500.0000 mg | ORAL_TABLET | Freq: Two times a day (BID) | ORAL | 0 refills | Status: DC

## 2020-07-17 ENCOUNTER — Telehealth: Payer: Self-pay

## 2020-07-17 NOTE — Telephone Encounter (Signed)
Patient name and DOB has been verified Patient was informed of lab results. Patient had no questions.  

## 2020-07-17 NOTE — Telephone Encounter (Signed)
-----   Message from Hoy Register, MD sent at 07/16/2020  4:38 PM EDT ----- She tested positive for Trichomonas which is an STD. I have sent a rx to her Pharmacy and her partner will need to be treated. She needs a repeat test in 1 month to ensure eradication

## 2020-07-21 ENCOUNTER — Ambulatory Visit: Payer: Medicaid Other | Admitting: Nurse Practitioner

## 2020-08-21 ENCOUNTER — Encounter: Payer: Self-pay | Admitting: Physician Assistant

## 2020-08-21 ENCOUNTER — Other Ambulatory Visit: Payer: Self-pay

## 2020-08-21 ENCOUNTER — Other Ambulatory Visit: Payer: Self-pay | Admitting: Physician Assistant

## 2020-08-21 ENCOUNTER — Ambulatory Visit: Payer: Self-pay | Attending: Physician Assistant | Admitting: Physician Assistant

## 2020-08-21 VITALS — BP 144/90 | HR 64 | Temp 98.7°F | Resp 18 | Ht 64.0 in | Wt 189.0 lb

## 2020-08-21 DIAGNOSIS — Z8619 Personal history of other infectious and parasitic diseases: Secondary | ICD-10-CM

## 2020-08-21 DIAGNOSIS — B009 Herpesviral infection, unspecified: Secondary | ICD-10-CM

## 2020-08-21 DIAGNOSIS — Z113 Encounter for screening for infections with a predominantly sexual mode of transmission: Secondary | ICD-10-CM

## 2020-08-21 DIAGNOSIS — J4540 Moderate persistent asthma, uncomplicated: Secondary | ICD-10-CM

## 2020-08-21 DIAGNOSIS — L309 Dermatitis, unspecified: Secondary | ICD-10-CM

## 2020-08-21 MED ORDER — TRIAMCINOLONE ACETONIDE 0.5 % EX OINT: 1.0000 "application " | TOPICAL_OINTMENT | Freq: Two times a day (BID) | CUTANEOUS | 6 refills | Status: DC

## 2020-08-21 MED ORDER — BUDESONIDE-FORMOTEROL FUMARATE 80-4.5 MCG/ACT IN AERO: 2.0000 | INHALATION_SPRAY | Freq: Two times a day (BID) | RESPIRATORY_TRACT | 0 refills | Status: DC

## 2020-08-21 MED ORDER — ALBUTEROL SULFATE HFA 108 (90 BASE) MCG/ACT IN AERS: 2.0000 | INHALATION_SPRAY | Freq: Four times a day (QID) | RESPIRATORY_TRACT | 1 refills | Status: DC | PRN

## 2020-08-21 MED ORDER — MONTELUKAST SODIUM 10 MG PO TABS: 10.0000 mg | ORAL_TABLET | Freq: Every day | ORAL | 4 refills | Status: DC

## 2020-08-21 MED ORDER — VALACYCLOVIR HCL 1 G PO TABS: 2000.0000 mg | ORAL_TABLET | Freq: Two times a day (BID) | ORAL | 0 refills | Status: DC

## 2020-08-21 MED FILL — TRIAMCINOLONE 0.5% OINTMENT: 0.5 | 15 days supply | Qty: 30 | Fill #0

## 2020-08-21 MED FILL — ALBUTEROL SULFATE HFA 108 (: 108 (90 BAS | 25 days supply | Qty: 18 | Fill #0

## 2020-08-21 MED FILL — MONTELUKAST SOD 10 MG TAB: 10 | 30 days supply | Qty: 30 | Fill #0

## 2020-08-21 MED FILL — SYMBICORT 80-4.5 MCG INH: 80-4.5 | 30 days supply | Qty: 10 | Fill #0

## 2020-08-21 MED FILL — valACYclovir HCL 1 GM TABS: 1 | 1 days supply | Qty: 4 | Fill #0

## 2020-08-21 NOTE — Progress Notes (Signed)
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 Patient would like medication on file for any future outbreaks. Patient denies pain at this time. Refill on on triamcinolone ointment.

## 2020-08-21 NOTE — Patient Instructions (Signed)
Please resume the Symbicort on a daily basis as well as the Singulair.   We will call you with your lab results.  Please let us know if there is any else we can do for you  Roney Jaffe, PA-C Physician Assistant [facility] [email or web address]  Asthma, Adult  Asthma is a long-term (chronic) condition that causes recurrent episodes in which the airways become tight and narrow. The airways are the passages that lead from the nose and mouth down into the lungs. Asthma episodes, also called asthma attacks, can cause coughing, wheezing, shortness of breath, and chest pain. The airways can also fill with mucus. During an attack, it can be difficult to breathe. Asthma attacks can range from minor to life threatening. Asthma cannot be cured, but medicines and lifestyle changes can help control it and treat acute attacks. What are the causes? This condition is believed to be caused by inherited (genetic) and environmental factors, but its exact cause is not known. There are many things that can bring on an asthma attack or make asthma symptoms worse (triggers). Asthma triggers are different for each person. Common triggers include:  Mold.  Dust.  Cigarette smoke.  Cockroaches.  Things that can cause allergy symptoms (allergens), such as animal dander or pollen from trees or grass.  Air pollutants such as household cleaners, wood smoke, smog, or Therapist, occupational.  Cold air, weather changes, and winds (which increase molds and pollen in the air).  Strong emotional expressions such as crying or laughing hard.  Stress.  Certain medicines (such as aspirin) or types of medicines (such as beta-blockers).  Sulfites in foods and drinks. Foods and drinks that may contain sulfites include dried fruit, potato chips, and sparkling grape juice.  Infections or inflammatory conditions  such as the flu, a cold, or inflammation of the nasal membranes (rhinitis).  Gastroesophageal reflux disease (GERD).  Exercise or strenuous activity. What are the signs or symptoms? Symptoms of this condition may occur right after asthma is triggered or many hours later. Symptoms include:  Wheezing. This can sound like whistling when you breathe.  Excessive nighttime or early morning coughing.  Frequent or severe coughing with a common cold.  Chest tightness.  Shortness of breath.  Tiredness (fatigue) with minimal activity. How is this diagnosed? This condition is diagnosed based on:  Your medical history.  A physical exam.  Tests, which may include: ? Lung function studies and pulmonary studies (spirometry). These tests can evaluate the flow of air in your lungs. ? Allergy tests. ? Imaging tests, such as X-rays. How is this treated? There is no cure for this condition, but treatment can help control your symptoms. Treatment for asthma usually involves:  Identifying and avoiding your asthma triggers.  Using medicines to control your symptoms. Generally, two types of medicines are used to treat asthma: ? Controller medicines. These help prevent asthma symptoms from occurring. They are usually taken every day. ? Fast-acting reliever or rescue medicines. These quickly relieve asthma symptoms by widening the narrow and tight airways. They are used as needed and provide short-term relief.  Using supplemental oxygen. This may be needed during a severe episode.  Using other medicines, such as: ? Allergy medicines, such as antihistamines, if your asthma attacks are triggered by allergens. ? Immune medicines (immunomodulators). These are medicines that help control the immune system.  Creating an asthma action plan. An asthma action plan is a written plan for managing and treating your asthma  attacks. This plan includes: ? A list of your asthma triggers and how to avoid  them. ? Information about when medicines should be taken and when their dosage should be changed. ? Instructions about using a device called a peak flow meter. A peak flow meter measures how well the lungs are working and the severity of your asthma. It helps you monitor your condition. Follow these instructions at home: Controlling your home environment Control your home environment in the following ways to help avoid triggers and prevent asthma attacks:  Change your heating and air conditioning filter regularly.  Limit your use of fireplaces and wood stoves.  Get rid of pests (such as roaches and mice) and their droppings.  Throw away plants if you see mold on them.  Clean floors and dust surfaces regularly. Use unscented cleaning products.  Try to have someone else vacuum for you regularly. Stay out of rooms while they are being vacuumed and for a short while afterward. If you vacuum, use a dust mask from a hardware store, a double-layered or microfilter vacuum cleaner bag, or a vacuum cleaner with a HEPA filter.  Replace carpet with wood, tile, or vinyl flooring. Carpet can trap dander and dust.  Use allergy-proof pillows, mattress covers, and box spring covers.  Keep your bedroom a trigger-free room.  Avoid pets and keep windows closed when allergens are in the air.  Wash beddings every week in hot water and dry them in a dryer.  Use blankets that are made of polyester or cotton.  Clean bathrooms and kitchens with bleach. If possible, have someone repaint the walls in these rooms with mold-resistant paint. Stay out of the rooms that are being cleaned and painted.  Wash your hands often with soap and water. If soap and water are not available, use hand sanitizer.  Do not allow anyone to smoke in your home. General instructions  Take over-the-counter and prescription medicines only as told by your health care provider. ? Speak with your health care provider if you have  questions about how or when to take the medicines. ? Make note if you are requiring more frequent dosages.  Do not use any products that contain nicotine or tobacco, such as cigarettes and e-cigarettes. If you need help quitting, ask your health care provider. Also, avoid being exposed to secondhand smoke.  Use a peak flow meter as told by your health care provider. Record and keep track of the readings.  Understand and use the asthma action plan to help minimize, or stop an asthma attack, without needing to seek medical care.  Make sure you stay up to date on your yearly vaccinations as told by your health care provider. This may include vaccines for the flu and pneumonia.  Avoid outdoor activities when allergen counts are high and when air quality is low.  Wear a ski mask that covers your nose and mouth during outdoor winter activities. Exercise indoors on cold days if you can.  Warm up before exercising, and take time for a cool-down period after exercise.  Keep all follow-up visits as told by your health care provider. This is important. Where to find more information  For information about asthma, turn to the Centers for Disease Control and Prevention at http://www.mills-berg.com/.htm  For air quality information, turn to [employer] at [employer] Contact a health care provider if:  You have wheezing, shortness of breath, or a cough even while you are taking medicine to prevent attacks.  The mucus you cough  up (sputum) is thicker than usual.  Your sputum changes from clear or white to yellow, green, gray, or bloody.  Your medicines are causing side effects, such as a rash, itching, swelling, or trouble breathing.  You need to use a reliever medicine more than 2-3 times a week.  Your peak flow reading is still at 50-79% of your personal best after following your action plan for 1 hour.  You have a fever. Get help right away if:  You are getting worse and do not respond to  treatment during an asthma attack.  You are short of breath when at rest or when doing very little physical activity.  You have difficulty eating, drinking, or talking.  You have chest pain or tightness.  You develop a fast heartbeat or palpitations.  You have a bluish color to your lips or fingernails.  You are light-headed or dizzy, or you faint.  Your peak flow reading is less than 50% of your personal best.  You feel too tired to breathe normally. Summary  Asthma is a long-term (chronic) condition that causes recurrent episodes in which the airways become tight and narrow. These episodes can cause coughing, wheezing, shortness of breath, and chest pain.  Asthma cannot be cured, but medicines and lifestyle changes can help control it and treat acute attacks.  Make sure you understand how to avoid triggers and how and when to use your medicines.  Asthma attacks can range from minor to life threatening. Get help right away if you have an asthma attack and do not respond to treatment with your usual rescue medicines. This information is not intended to replace advice given to you by your health care provider. Make sure you discuss any questions you have with your health care provider. Document Revised: 11/01/2018 Document Reviewed: 10/03/2016 Elsevier Patient Education  2020 [employer].

## 2020-08-21 NOTE — Progress Notes (Signed)
Established Patient Office Visit  Subjective:  Patient ID: Lauren Terry, female    DOB: 06/12/1986  Age: 34 y.o. MRN: [id number]  CC:  Chief Complaint  Patient presents with  . Exposure to STD    HPI Lauren Terry reports that she was recently treated for trichomonas, states she did complete her treatment. Denies any new STD symptoms.  Reports that she has been using her albuterol inhaler approximately twice a day, states that she has been out of her Symbicort due to financial constraints.  Does endorse nighttime awakenings 1-2 times a month.  Patient requests refill of steroid cream for eczema, states that it does offer relief when she has flareups.     Past Medical History:  Diagnosis Date  . Asthma     No past surgical history on file.  Family History  Problem Relation Age of Onset  . Diabetes Maternal Aunt   . Cancer Paternal Grandmother   . Breast cancer Paternal Grandmother     Social History   Socioeconomic History  . Marital status: Single    Spouse name: Not on file  . Number of children: Not on file  . Years of education: Not on file  . Highest education level: Not on file  Occupational History  . Not on file  Tobacco Use  . Smoking status: Current Some Day Smoker    Packs/day: 0.01    Types: Cigarettes  . Smokeless tobacco: Never Used  Vaping Use  . Vaping Use: Never used  Substance and Sexual Activity  . Alcohol use: Not Currently  . Drug use: Yes    Types: Marijuana  . Sexual activity: Yes  Other Topics Concern  . Not on file  Social History Narrative   Patient is a single mother of 4 children ( 2 boys and 2 girls)   Social Determinants of Health   Financial Resource Strain: Not on file  Food Insecurity: Not on file  Transportation Needs: Not on file  Physical Activity: Not on file  Stress: Not on file  Social Connections: Not on file  Intimate Partner Violence: Not on file    Outpatient Medications Prior to Visit   Medication Sig Dispense Refill  . acetaminophen (TYLENOL) 325 MG tablet Take 650 mg by mouth every 6 (six) hours as needed for mild pain or headache.    . albuterol (PROVENTIL) (2.5 MG/3ML) 0.083% nebulizer solution Take 3 mLs (2.5 mg total) by nebulization every 6 (six) hours as needed for wheezing or shortness of breath. 150 mL 3  . albuterol (VENTOLIN HFA) 108 (90 Base) MCG/ACT inhaler Inhale 2 puffs into the lungs every 6 (six) hours as needed for wheezing or shortness of breath. 18 g 1  . budesonide-formoterol (SYMBICORT) 80-4.5 MCG/ACT inhaler Inhale 2 puffs into the lungs 2 (two) times daily. 10.2 g 0  . triamcinolone ointment (KENALOG) 0.5 % Apply 1 application topically 2 (two) times daily. 30 g 6  . valACYclovir (VALTREX) 1000 MG tablet Take 2 tablets (2,000 mg total) by mouth 2 (two) times daily. 8 tablet 0  . montelukast (SINGULAIR) 10 MG tablet Take 1 tablet (10 mg total) by mouth at bedtime. (Patient not taking: No sig reported) 30 tablet 4  . Pseudoeph-Doxylamine-DM-APAP (NYQUIL PO) Take 1 capsule by mouth at bedtime as needed (congestion/sleep). (Patient not taking: No sig reported)    . cephALEXin (KEFLEX) 500 MG capsule Take 1 capsule (500 mg total) by mouth 2 (two) times daily. 10 capsule 0  . famotidine (  PEPCID) 20 MG tablet Take 1 tablet (20 mg total) by mouth 2 (two) times daily for 14 days. (Patient not taking: Reported on 12/20/2019) 28 tablet 0  . metroNIDAZOLE (FLAGYL) 500 MG tablet Take 1 tablet (500 mg total) by mouth 2 (two) times daily. 14 tablet 0   No facility-administered medications prior to visit.    No Known Allergies  ROS Review of Systems  Constitutional: Negative for chills and fever.  HENT: Negative.   Eyes: Negative.   Respiratory: Positive for shortness of breath and wheezing.   Cardiovascular: Negative for palpitations.  Gastrointestinal: Negative.   Endocrine: Negative.   Genitourinary: Negative for dysuria, genital sores and vaginal discharge.   Musculoskeletal: Negative.   Allergic/Immunologic: Negative.   Neurological: Negative.   Hematological: Negative.   Psychiatric/Behavioral: Negative.       Objective:    Physical Exam Vitals and nursing note reviewed.  Constitutional:      Appearance: Normal appearance.  HENT:     Head: Atraumatic.     Right Ear: External ear normal.     Left Ear: External ear normal.     Nose: Nose normal.     Mouth/Throat:     Mouth: Mucous membranes are moist.     Pharynx: Oropharynx is clear.  Eyes:     Extraocular Movements: Extraocular movements intact.     Conjunctiva/sclera: Conjunctivae normal.     Pupils: Pupils are equal, round, and reactive to light.  Cardiovascular:     Rate and Rhythm: Normal rate and regular rhythm.     Pulses: Normal pulses.     Heart sounds: Normal heart sounds.  Pulmonary:     Effort: Pulmonary effort is normal.     Breath sounds: Examination of the left-upper field reveals wheezing. Wheezing present. No decreased breath sounds.  Abdominal:     General: Abdomen is flat.     Palpations: Abdomen is soft.  Musculoskeletal:        General: Normal range of motion.     Cervical back: Normal range of motion and neck supple.  Skin:    General: Skin is warm and dry.  Neurological:     General: No focal deficit present.     Mental Status: She is oriented to person, place, and time.  Psychiatric:        Mood and Affect: Mood normal.        Thought Content: Thought content normal.        Judgment: Judgment normal.     BP (!) 144/90 (BP Location: Right Arm, Patient Position: Sitting, Cuff Size: Normal)   Pulse 64   Temp 98.7 F (37.1 C) (Oral)   Resp 18   Ht 5\' 4"  (1.626 m)   Wt 189 lb (85.7 kg)   LMP 08/06/2020   SpO2 100%   BMI 32.44 kg/m  Wt Readings from Last 3 Encounters:  08/21/20 189 lb (85.7 kg)  07/14/20 185 lb (83.9 kg)  12/20/19 174 lb 12.8 oz (79.3 kg)     Health Maintenance Due  Topic Date Due  . Hepatitis C Screening  Never  done  . COVID-19 Vaccine (1) Never done  . TETANUS/TDAP  Never done    There are no preventive care reminders to display for this patient.  No results found for: TSH Lab Results  Component Value Date   WBC 9.8 10/22/2018   HGB 11.7 (L) 10/22/2018   HCT 38.6 10/22/2018   MCV 91.5 10/22/2018   PLT 326 10/22/2018  Lab Results  Component Value Date   NA 137 10/22/2018   K 3.7 10/22/2018   CO2 24 10/22/2018   GLUCOSE 98 10/22/2018   BUN 11 10/22/2018   CREATININE 0.75 10/22/2018   BILITOT 0.2 (L) 10/22/2018   ALKPHOS 83 10/22/2018   AST 24 10/22/2018   ALT 21 10/22/2018   PROT 7.2 10/22/2018   ALBUMIN 4.0 10/22/2018   CALCIUM 8.7 (L) 10/22/2018   ANIONGAP 7 10/22/2018   No results found for: CHOL No results found for: HDL No results found for: LDLCALC No results found for: TRIG No results found for: CHOLHDL No results found for: HDQQ2W    Assessment & Plan:   Problem List Items Addressed This Visit   None   Visit Diagnoses    Screen for STD (sexually transmitted disease)    -  Primary   Relevant Orders   Cervicovaginal ancillary only    1. Screen for STD (sexually transmitted disease) Screen for test of cure, patient education given on safe sex practices - Cervicovaginal ancillary only  2. Moderate persistent asthma without complication Patient given information on [place] financial assistance, encouraged to resume Symbicort and Singulair on a daily basis.  Patient given appointment to follow-up with primary care provider. - albuterol (VENTOLIN HFA) 108 (90 Base) MCG/ACT inhaler; Inhale 2 puffs into the lungs every 6 (six) hours as needed for wheezing or shortness of breath.  Dispense: 18 g; Refill: 1 - montelukast (SINGULAIR) 10 MG tablet; Take 1 tablet (10 mg total) by mouth at bedtime.  Dispense: 30 tablet; Refill: 4 - budesonide-formoterol (SYMBICORT) 80-4.5 MCG/ACT inhaler; Inhale 2 puffs into the lungs 2 (two) times daily.  Dispense: 10.2 g; Refill:  0  3. History of trichomoniasis   I have reviewed the patient's medical history (PMH, PSH, Social History, Family History, Medications, and allergies) , and have been updated if relevant. I spent 30 minutes reviewing chart and  face to face time with patient.      No orders of the defined types were placed in this encounter.   Follow-up: No follow-ups on file.    Kasandra Knudsen Zabrina Brotherton, PA-C

## 2020-08-23 DIAGNOSIS — L309 Dermatitis, unspecified: Secondary | ICD-10-CM | POA: Insufficient documentation

## 2020-08-23 DIAGNOSIS — B009 Herpesviral infection, unspecified: Secondary | ICD-10-CM | POA: Insufficient documentation

## 2020-08-23 DIAGNOSIS — Z8619 Personal history of other infectious and parasitic diseases: Secondary | ICD-10-CM | POA: Insufficient documentation

## 2020-08-24 LAB — CERVICOVAGINAL ANCILLARY ONLY
Bacterial Vaginitis (gardnerella): NEGATIVE
Chlamydia: NEGATIVE
Comment: NEGATIVE
Comment: NEGATIVE
Comment: NEGATIVE
Comment: NORMAL
Neisseria Gonorrhea: NEGATIVE
Trichomonas: NEGATIVE

## 2020-08-27 ENCOUNTER — Telehealth: Payer: Self-pay | Admitting: *Deleted

## 2020-08-27 NOTE — Telephone Encounter (Signed)
Patient verified DOB Patient is aware of being negative for Trich.

## 2020-08-27 NOTE — Telephone Encounter (Signed)
Medical Assistant left message on patient's home and cell voicemail. Voicemail states to give a call back to [place] with [facility] at [phone]. !Patient was negative for Trich!

## 2020-08-27 NOTE — Telephone Encounter (Signed)
-----   Message from Roney Jaffe, [place] sent at 08/24/2020  2:26 PM EST ----- Please call patient and let her know that her vaginal swab was negative for trichomonas

## 2020-11-19 ENCOUNTER — Ambulatory Visit: Payer: Medicaid Other | Admitting: Family Medicine

## 2021-04-28 ENCOUNTER — Other Ambulatory Visit: Payer: Self-pay

## 2021-04-28 MED FILL — Albuterol Sulfate Inhal Aero 108 MCG/ACT (90MCG Base Equiv): RESPIRATORY_TRACT | 25 days supply | Qty: 18 | Fill #0 | Status: CN

## 2021-05-05 ENCOUNTER — Other Ambulatory Visit: Payer: Self-pay

## 2021-05-11 ENCOUNTER — Encounter: Payer: Self-pay | Admitting: Family Medicine

## 2021-05-11 ENCOUNTER — Ambulatory Visit: Payer: Medicaid Other | Attending: Family Medicine | Admitting: Family Medicine

## 2021-05-11 DIAGNOSIS — R102 Pelvic and perineal pain: Secondary | ICD-10-CM

## 2021-05-11 DIAGNOSIS — J4540 Moderate persistent asthma, uncomplicated: Secondary | ICD-10-CM

## 2021-05-11 MED ORDER — ALBUTEROL SULFATE (2.5 MG/3ML) 0.083% IN NEBU: 2.5000 mg | INHALATION_SOLUTION | Freq: Four times a day (QID) | RESPIRATORY_TRACT | 3 refills | Status: DC | PRN

## 2021-05-11 MED ORDER — ALBUTEROL SULFATE HFA 108 (90 BASE) MCG/ACT IN AERS: 2.0000 | INHALATION_SPRAY | Freq: Four times a day (QID) | RESPIRATORY_TRACT | 3 refills | Status: DC | PRN

## 2021-05-11 NOTE — Progress Notes (Signed)
Virtual Visit via Telephone Note  I connected with Lauren Terry, on 05/11/2021 at 2:18 PM by telephone due to the COVID-19 pandemic and verified that I am speaking with the correct person using two identifiers.   Consent: I discussed the limitations, risks, security and privacy concerns of performing an evaluation and management service by telephone and the availability of in person appointments. I also discussed with the patient that there may be a patient responsible charge related to this service. The patient expressed understanding and agreed to proceed.   Location of Patient: Home  Location of Provider: Clinic   Persons participating in Telemedicine visit: Lauren Terry Dr. Alvis Lemmings     History of Present Illness: Lauren Terry is a 35 y.o. year old female with history of asthma who presents today for chronic disease management.   She would like to be tested for STD as she has lower abdominal pain but denies presence of dysuria, vaginal discharge, recent change in sexual partner.  She has no flank pain, nausea or vomiting. Also requests refills of her albuterol MDI and albuterol nebulized.  Does not use Symbicort or Singulair. Denies additional concerns today.  Past Medical History:  Diagnosis Date   Asthma    No Known Allergies  Current Outpatient Medications on File Prior to Visit  Medication Sig Dispense Refill   acetaminophen (TYLENOL) 325 MG tablet Take 650 mg by mouth every 6 (six) hours as needed for mild pain or headache.     albuterol (PROVENTIL) (2.5 MG/3ML) 0.083% nebulizer solution Take 3 mLs (2.5 mg total) by nebulization every 6 (six) hours as needed for wheezing or shortness of breath. 150 mL 3   albuterol (VENTOLIN HFA) 108 (90 Base) MCG/ACT inhaler INHALE 2 PUFFS INTO THE LUNGS EVERY 6 (SIX) HOURS AS NEEDED FOR WHEEZING OR SHORTNESS OF BREATH. 18 g 1   budesonide-formoterol (SYMBICORT) 80-4.5 MCG/ACT inhaler INHALE 2 PUFFS INTO THE LUNGS 2  (TWO) TIMES DAILY. 10.2 g 0   montelukast (SINGULAIR) 10 MG tablet TAKE 1 TABLET (10 MG TOTAL) BY MOUTH AT BEDTIME. 30 tablet 4   Pseudoeph-Doxylamine-DM-APAP (NYQUIL PO) Take 1 capsule by mouth at bedtime as needed (congestion/sleep). (Patient not taking: No sig reported)     triamcinolone ointment (KENALOG) 0.5 % APPLY 1 APPLICATION TOPICALLY 2 (TWO) TIMES DAILY. 30 g 6   valACYclovir (VALTREX) 1000 MG tablet TAKE 2 TABLETS (2,000 MG TOTAL) BY MOUTH 2 (TWO) TIMES DAILY FOR 1 DAY. 4 tablet 0   No current facility-administered medications on file prior to visit.    ROS: See HPI  Observations/Objective: Awake, alert, oriented x3 Not in acute distress Normal  CMP Latest Ref Rng & Units 10/22/2018  Glucose 70 - 99 mg/dL 98  BUN 6 - 20 mg/dL 11  Creatinine 2.97 - 9.89 mg/dL 2.11  Sodium 941 - 740 mmol/L 137  Potassium 3.5 - 5.1 mmol/L 3.7  Chloride 98 - 111 mmol/L 106  CO2 22 - 32 mmol/L 24  Calcium 8.9 - 10.3 mg/dL 8.1(K)  Total Protein 6.5 - 8.1 g/dL 7.2  Total Bilirubin 0.3 - 1.2 mg/dL 4.8(J)  Alkaline Phos 38 - 126 U/L 83  AST 15 - 41 U/L 24  ALT 0 - 44 U/L 21    Assessment and Plan: 1. Moderate persistent asthma without complication Stable - albuterol (PROVENTIL) (2.5 MG/3ML) 0.083% nebulizer solution; Take 3 mLs (2.5 mg total) by nebulization every 6 (six) hours as needed for wheezing or shortness of breath.  Dispense: 150 mL; Refill:  3 - albuterol (VENTOLIN HFA) 108 (90 Base) MCG/ACT inhaler; INHALE 2 PUFFS INTO THE LUNGS EVERY 6 (SIX) HOURS AS NEEDED FOR WHEEZING OR SHORTNESS OF BREATH.  Dispense: 18 g; Refill: 3  2. Pelvic pain She has no urinary symptoms Will send of vaginal swab - Cervicovaginal ancillary only; Future   Follow Up Instructions: Return if symptoms persist or fail to improve.   I discussed the assessment and treatment plan with the patient. The patient was provided an opportunity to ask questions and all were answered. The patient agreed with the  plan and demonstrated an understanding of the instructions.   The patient was advised to call back or seek an in-person evaluation if the symptoms worsen or if the condition fails to improve as anticipated.     I provided 9 minutes total of non-face-to-face time during this encounter.   Hoy Register, MD, FAAFP. [facility] [place], [place] [phone]   05/11/2021, 2:18 PM

## 2021-05-12 ENCOUNTER — Other Ambulatory Visit (HOSPITAL_COMMUNITY)
Admission: RE | Admit: 2021-05-12 | Discharge: 2021-05-12 | Disposition: A | Discharge status: Home / Self Care | Payer: Medicaid Other | Source: Ambulatory Visit | Attending: Family Medicine | Admitting: Family Medicine

## 2021-05-12 ENCOUNTER — Other Ambulatory Visit: Payer: Self-pay

## 2021-05-12 DIAGNOSIS — R102 Pelvic and perineal pain: Secondary | ICD-10-CM

## 2021-05-13 LAB — CERVICOVAGINAL ANCILLARY ONLY
Bacterial Vaginitis (gardnerella): NEGATIVE
Candida Glabrata: NEGATIVE
Candida Vaginitis: NEGATIVE
Chlamydia: NEGATIVE
Comment: NEGATIVE
Comment: NEGATIVE
Comment: NEGATIVE
Comment: NEGATIVE
Comment: NEGATIVE
Comment: NORMAL
Neisseria Gonorrhea: NEGATIVE
Trichomonas: NEGATIVE

## 2021-09-16 ENCOUNTER — Ambulatory Visit: Payer: Self-pay | Admitting: *Deleted

## 2021-09-16 NOTE — Telephone Encounter (Signed)
Reason for Disposition  Unusual vaginal discharge (e.g., bad smelling, yellow, green, or foamy-white)  Answer Assessment - Initial Assessment Questions 1. LOCATION: "Where does it hurt?"      Below bellybutton- midline 2. RADIATION: "Does the pain shoot anywhere else?" (e.g., chest, back)     lower 3. ONSET: "When did the pain begin?" (e.g., minutes, hours or days ago)      1 week 4. SUDDEN: "Gradual or sudden onset?"     gradual 5. PATTERN "Does the pain come and go, or is it constant?"    - If constant: "Is it getting better, staying the same, or worsening?"      (Note: Constant means the pain never goes away completely; most serious pain is constant and it progresses)     - If intermittent: "How long does it last?" "Do you have pain now?"     (Note: Intermittent means the pain goes away completely between bouts)     Comes-goes- seconds 6. SEVERITY: "How bad is the pain?"  (e.g., Scale 1-10; mild, moderate, or severe)   - MILD (1-3): doesn't interfere with normal activities, abdomen soft and not tender to touch    - MODERATE (4-7): interferes with normal activities or awakens from sleep, abdomen tender to touch    - SEVERE (8-10): excruciating pain, doubled over, unable to do any normal activities      6- moderate 7. RECURRENT SYMPTOM: "Have you ever had this type of stomach pain before?" If Yes, ask: "When was the last time?" and "What happened that time?"      Yes- STD 8. CAUSE: "What do you think is causing the stomach pain?"     Not sure 9. RELIEVING/AGGRAVATING FACTORS: "What makes it better or worse?" (e.g., movement, antacids, bowel movement)     motrin 10. OTHER SYMPTOMS: "Do you have any other symptoms?" (e.g., back pain, diarrhea, fever, urination pain, vomiting)       Diarrhea often 11. PREGNANCY: "Is there any chance you are pregnant?" "When was your last menstrual period?"       No- BTL  Protocols used: Abdominal Pain - [facility]

## 2021-09-16 NOTE — Telephone Encounter (Signed)
If she declines a mobile unit appointment unfortunately she may have to be placed on the wait list until an appointment opens up.

## 2021-09-16 NOTE — Telephone Encounter (Signed)
°  Chief Complaint: lower abdominal pain-1 week Symptoms: lower abdominal pain- mid,ine- comes/goes, moderate, vaginal discharge Frequency: 1 week Pertinent Negatives: Patient denies fever, urinary symptoms Disposition: [] ED /[x] Urgent Care (no appt availability in office) / [] Appointment(In office/virtual)/ []  [facility]/ [] Home Care/ [] Refused Recommended Disposition /[] [facility]/ []  Follow-up with PCP Additional Notes: Patient declines mobile unit

## 2021-09-20 ENCOUNTER — Encounter: Payer: Self-pay | Admitting: Nurse Practitioner

## 2021-09-20 ENCOUNTER — Other Ambulatory Visit (HOSPITAL_COMMUNITY)
Admission: RE | Admit: 2021-09-20 | Discharge: 2021-09-20 | Disposition: A | Discharge status: Home / Self Care | Payer: BLUE CROSS/BLUE SHIELD | Source: Ambulatory Visit | Attending: Nurse Practitioner | Admitting: Nurse Practitioner

## 2021-09-20 ENCOUNTER — Other Ambulatory Visit: Payer: Self-pay

## 2021-09-20 ENCOUNTER — Ambulatory Visit: Payer: Medicaid Other | Attending: Nurse Practitioner | Admitting: Nurse Practitioner

## 2021-09-20 VITALS — BP 117/76 | HR 73 | Ht 64.0 in | Wt 180.2 lb

## 2021-09-20 DIAGNOSIS — B9689 Other specified bacterial agents as the cause of diseases classified elsewhere: Secondary | ICD-10-CM | POA: Diagnosis not present

## 2021-09-20 DIAGNOSIS — Z1159 Encounter for screening for other viral diseases: Secondary | ICD-10-CM | POA: Diagnosis not present

## 2021-09-20 DIAGNOSIS — N760 Acute vaginitis: Secondary | ICD-10-CM | POA: Insufficient documentation

## 2021-09-20 DIAGNOSIS — R102 Pelvic and perineal pain: Secondary | ICD-10-CM

## 2021-09-20 DIAGNOSIS — Z114 Encounter for screening for human immunodeficiency virus [HIV]: Secondary | ICD-10-CM

## 2021-09-20 DIAGNOSIS — Z8742 Personal history of other diseases of the female genital tract: Secondary | ICD-10-CM | POA: Diagnosis not present

## 2021-09-20 DIAGNOSIS — R1084 Generalized abdominal pain: Secondary | ICD-10-CM | POA: Insufficient documentation

## 2021-09-20 LAB — POCT URINALYSIS DIP (CLINITEK)
Bilirubin, UA: NEGATIVE
Blood, UA: NEGATIVE
Glucose, UA: NEGATIVE mg/dL
Ketones, POC UA: NEGATIVE mg/dL
Leukocytes, UA: NEGATIVE
Nitrite, UA: NEGATIVE
POC PROTEIN,UA: NEGATIVE
Spec Grav, UA: 1.015 (ref 1.010–1.025)
Urobilinogen, UA: 0.2 E.U./dL
pH, UA: 6.5 (ref 5.0–8.0)

## 2021-09-20 NOTE — Progress Notes (Signed)
Assessment & Plan:  Lauren Terry was seen today for abdominal pain.  Diagnoses and all orders for this visit:  Pelvic pain -     POCT URINALYSIS DIP (CLINITEK) -     Cervicovaginal ancillary only  Encounter for screening for HIV -     HIV antibody (with reflex)  Need for hepatitis C screening test -     HCV Ab w Reflex to Quant PCR    Patient has been counseled on age-appropriate routine health concerns for screening and prevention. These are reviewed and up-to-date. Referrals have been placed accordingly. Immunizations are up-to-date or declined.    Subjective:   Chief Complaint  Patient presents with   Abdominal Pain   HPI Lauren Terry 36 y.o. female presents to office today with complaints of abdominal pain.   Endorses lower pelvic pain. Pain described as cramping. She finished her menstrual cycle over a week ago.  Notices maladorous vaginal discharge and states she has a history of BV. There is also a history of trichomonas. She denies nausea, vomiting, flank pain or UTI symptoms.    Review of Systems  Constitutional:  Negative for fever, malaise/fatigue and weight loss.  HENT: Negative.  Negative for nosebleeds.   Eyes: Negative.  Negative for blurred vision, double vision and photophobia.  Respiratory: Negative.  Negative for cough and shortness of breath.   Cardiovascular: Negative.  Negative for chest pain, palpitations and leg swelling.  Gastrointestinal: Negative.  Negative for heartburn, nausea and vomiting.  Genitourinary:        SEE HPI  Musculoskeletal: Negative.  Negative for myalgias.  Neurological: Negative.  Negative for dizziness, focal weakness, seizures and headaches.  Psychiatric/Behavioral: Negative.  Negative for suicidal ideas.    Past Medical History:  Diagnosis Date   Asthma     History reviewed. No pertinent surgical history.  Family History  Problem Relation Age of Onset   Diabetes Maternal Aunt    Cancer Paternal Grandmother     Breast cancer Paternal Grandmother     Social History Reviewed with no changes to be made today.   Outpatient Medications Prior to Visit  Medication Sig Dispense Refill   acetaminophen (TYLENOL) 325 MG tablet Take 650 mg by mouth every 6 (six) hours as needed for mild pain or headache.     albuterol (PROVENTIL) (2.5 MG/3ML) 0.083% nebulizer solution Take 3 mLs (2.5 mg total) by nebulization every 6 (six) hours as needed for wheezing or shortness of breath. 150 mL 3   albuterol (VENTOLIN HFA) 108 (90 Base) MCG/ACT inhaler INHALE 2 PUFFS INTO THE LUNGS EVERY 6 (SIX) HOURS AS NEEDED FOR WHEEZING OR SHORTNESS OF BREATH. 18 g 3   Pseudoeph-Doxylamine-DM-APAP (NYQUIL PO) Take 1 capsule by mouth at bedtime as needed (congestion/sleep).     budesonide-formoterol (SYMBICORT) 80-4.5 MCG/ACT inhaler INHALE 2 PUFFS INTO THE LUNGS 2 (TWO) TIMES DAILY. 10.2 g 0   montelukast (SINGULAIR) 10 MG tablet TAKE 1 TABLET (10 MG TOTAL) BY MOUTH AT BEDTIME. 30 tablet 4   No facility-administered medications prior to visit.    No Known Allergies     Objective:    BP 117/76    Pulse 73    Ht 5\' 4"  (1.626 m)    Wt 180 lb 4 oz (81.8 kg)    LMP 09/12/2021    SpO2 99%    BMI 30.94 kg/m  Wt Readings from Last 3 Encounters:  09/20/21 180 lb 4 oz (81.8 kg)  08/21/20 189 lb (85.7 kg)  07/14/20 185 lb (83.9 kg)    Physical Exam Vitals and nursing note reviewed.  Constitutional:      Appearance: She is well-developed.  HENT:     Head: Normocephalic and atraumatic.  Cardiovascular:     Rate and Rhythm: Normal rate and regular rhythm.     Heart sounds: Normal heart sounds. No murmur heard.   No friction rub. No gallop.  Pulmonary:     Effort: Pulmonary effort is normal. No tachypnea or respiratory distress.     Breath sounds: Normal breath sounds. No decreased breath sounds, wheezing, rhonchi or rales.  Chest:     Chest wall: No tenderness.  Abdominal:     General: Bowel sounds are normal.     Palpations:  Abdomen is soft.  Musculoskeletal:        General: Normal range of motion.     Cervical back: Normal range of motion.  Skin:    General: Skin is warm and dry.  Neurological:     Mental Status: She is alert and oriented to person, place, and time.     Coordination: Coordination normal.  Psychiatric:        Behavior: Behavior normal. Behavior is cooperative.        Thought Content: Thought content normal.        Judgment: Judgment normal.         Patient has been counseled extensively about nutrition and exercise as well as the importance of adherence with medications and regular follow-up. The patient was given clear instructions to go to ER or return to medical center if symptoms don't improve, worsen or new problems develop. The patient verbalized understanding.   Follow-up: Return if symptoms worsen or fail to improve.   Claiborne Rigg, FNP-BC [facility] [place], [place] [phone]   09/20/2021, 12:21 PM

## 2021-09-20 NOTE — Patient Instructions (Addendum)
Boric Acid Suppositories for Bacterial vaginosis symptoms

## 2021-09-21 LAB — CERVICOVAGINAL ANCILLARY ONLY
Bacterial Vaginitis (gardnerella): POSITIVE — AB
Candida Glabrata: NEGATIVE
Candida Vaginitis: NEGATIVE
Chlamydia: NEGATIVE
Comment: NEGATIVE
Comment: NEGATIVE
Comment: NEGATIVE
Comment: NEGATIVE
Comment: NEGATIVE
Comment: NORMAL
Neisseria Gonorrhea: NEGATIVE
Trichomonas: NEGATIVE

## 2021-09-22 ENCOUNTER — Other Ambulatory Visit: Payer: Self-pay | Admitting: Nurse Practitioner

## 2021-09-22 MED ORDER — METRONIDAZOLE 500 MG PO TABS: 500.0000 mg | ORAL_TABLET | Freq: Two times a day (BID) | ORAL | 0 refills | Status: AC

## 2021-09-24 ENCOUNTER — Telehealth: Payer: Self-pay | Admitting: Family Medicine

## 2021-09-24 NOTE — Telephone Encounter (Signed)
Copied from CRM [id number]. Topic: General - Other >> Sep 22, 2021 12:25 PM Pawlus, Maxine Glenn A wrote: Reason for CRM: Pt called in to go over her latest lab results, pt stated the call would have to be after 4pm if possible, please advise.

## 2021-09-24 NOTE — Telephone Encounter (Signed)
Pt has been sent a mychart message. 

## 2022-02-22 DIAGNOSIS — A5901 Trichomonal vulvovaginitis: Secondary | ICD-10-CM | POA: Diagnosis not present

## 2022-02-22 DIAGNOSIS — B3731 Acute candidiasis of vulva and vagina: Secondary | ICD-10-CM | POA: Diagnosis not present

## 2022-02-22 DIAGNOSIS — Z202 Contact with and (suspected) exposure to infections with a predominantly sexual mode of transmission: Secondary | ICD-10-CM | POA: Diagnosis not present

## 2022-02-22 DIAGNOSIS — Z113 Encounter for screening for infections with a predominantly sexual mode of transmission: Secondary | ICD-10-CM | POA: Diagnosis not present

## 2022-03-21 ENCOUNTER — Ambulatory Visit: Payer: Self-pay

## 2022-03-21 ENCOUNTER — Telehealth: Payer: BLUE CROSS/BLUE SHIELD | Admitting: Nurse Practitioner

## 2022-03-21 DIAGNOSIS — N3000 Acute cystitis without hematuria: Secondary | ICD-10-CM

## 2022-03-21 MED ORDER — NITROFURANTOIN MONOHYD MACRO 100 MG PO CAPS: 100.0000 mg | ORAL_CAPSULE | Freq: Two times a day (BID) | ORAL | 0 refills | Status: AC

## 2022-03-21 NOTE — Progress Notes (Signed)
Virtual Visit Consent   Lauren Terry, you are scheduled for a virtual visit with a [facility] provider today. Just as with appointments in the office, your consent must be obtained to participate. Your consent will be active for this visit and any virtual visit you may have with one of our providers in the next 365 days. If you have a MyChart account, a copy of this consent can be sent to you electronically.  As this is a virtual visit, video technology does not allow for your provider to perform a traditional examination. This may limit your provider's ability to fully assess your condition. If your provider identifies any concerns that need to be evaluated in person or the need to arrange testing (such as labs, EKG, etc.), we will make arrangements to do so. Although advances in technology are sophisticated, we cannot ensure that it will always work on either your end or our end. If the connection with a video visit is poor, the visit may have to be switched to a telephone visit. With either a video or telephone visit, we are not always able to ensure that we have a secure connection.  By engaging in this virtual visit, you consent to the provision of healthcare and authorize for your insurance to be billed (if applicable) for the services provided during this visit. Depending on your insurance coverage, you may receive a charge related to this service.  I need to obtain your verbal consent now. Are you willing to proceed with your visit today? Lauren Terry has provided verbal consent on 03/21/2022 for a virtual visit (video or telephone). Viviano Simas, FNP  Date: 03/21/2022 12:21 PM  Virtual Visit via Video Note   I, Viviano Simas, connected with  Lauren Terry  ([id number], 08-Dec-1985) on 03/21/22 at 12:30 PM EDT by a video-enabled telemedicine application and verified that I am speaking with the correct person using two identifiers.  Location: Patient: Virtual Visit Location Patient:  Home Provider: Virtual Visit Location Provider: Home Office   I discussed the limitations of evaluation and management by telemedicine and the availability of in person appointments. The patient expressed understanding and agreed to proceed.    History of Present Illness: Lauren Terry is a 36 y.o. who identifies as a female who was assigned female at birth, and is being seen today with symptoms of a UTI that started this morning.   She is having abdominal cramping when she urinates.  When she urinates she has pain and burning at the end of urination and notes a small amount of blood when she wiped.   She denies back pain, Nausea vomiting or fever.   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   Moderate persistent asthma 05/30/2018    Allergies: No Known Allergies Medications:  Current Outpatient Medications:    acetaminophen (TYLENOL) 325 MG tablet, Take 650 mg by mouth every 6 (six) hours as needed for mild pain or headache., Disp: , Rfl:    albuterol (PROVENTIL) (2.5 MG/3ML) 0.083% nebulizer solution, Take 3 mLs (2.5 mg total) by nebulization every 6 (six) hours as needed for wheezing or shortness of breath., Disp: 150 mL, Rfl: 3   albuterol (VENTOLIN HFA) 108 (90 Base) MCG/ACT inhaler, INHALE 2 PUFFS INTO THE LUNGS EVERY 6 (SIX) HOURS AS NEEDED FOR WHEEZING  OR SHORTNESS OF BREATH., Disp: 18 g, Rfl: 3   budesonide-formoterol (SYMBICORT) 80-4.5 MCG/ACT inhaler, INHALE 2 PUFFS INTO THE LUNGS 2 (TWO) TIMES DAILY., Disp: 10.2 g, Rfl: 0   montelukast (SINGULAIR) 10 MG tablet, TAKE 1 TABLET (10 MG TOTAL) BY MOUTH AT BEDTIME., Disp: 30 tablet, Rfl: 4   Pseudoeph-Doxylamine-DM-APAP (NYQUIL PO), Take 1 capsule by mouth at bedtime as needed (congestion/sleep)., Disp: , Rfl:    Observations/Objective: Patient is well-developed, well-nourished in no acute distress.  Resting comfortably at home.  Head is normocephalic, atraumatic.  No labored breathing.  Speech is clear and coherent with logical content.  Patient is alert and oriented at baseline.    Assessment and Plan: 1. Acute cystitis without hematuria  - nitrofurantoin, macrocrystal-monohydrate, (MACROBID) 100 MG capsule; Take 1 capsule (100 mg total) by mouth 2 (two) times daily for 5 days.  Dispense: 10 capsule; Refill: 0     Follow Up Instructions: I discussed the assessment and treatment plan with the patient. The patient was provided an opportunity to ask questions and all were answered. The patient agreed with the plan and demonstrated an understanding of the instructions.  A copy of instructions were sent to the patient via MyChart unless otherwise noted below.    The patient was advised to call back or seek an in-person evaluation if the symptoms worsen or if the condition fails to improve as anticipated.  Time:  I spent 10 minutes with the patient via telehealth technology discussing the above problems/concerns.    Viviano Simas, FNP

## 2022-03-21 NOTE — Telephone Encounter (Signed)
  Chief Complaint: pelvic bladder pressure Symptoms: feels at the end or urination, frequency, burning and noted small amount of blood on toilet paper Frequency: this am Pertinent Negatives: Patient denies fever, flank pain Disposition: [] ED /[] Urgent Care (no appt availability in office) / [] Appointment(In office/virtual)/ [x]  [facility]/ [] Home Care/ [] Refused Recommended Disposition /[] [facility]/ []  Follow-up with PCP Additional Notes: no available appts -  VV made for pt at 12:30 today.  Reason for Disposition  Urinating more frequently than usual (i.e., frequency)  Answer Assessment - Initial Assessment Questions 1. LOCATION: "Where does it hurt?"      Lower abdomen- not urinating as much - see small amount bladder presure 2. RADIATION: "Does the pain shoot anywhere else?" (e.g., chest, back)     no 3. ONSET: "When did the pain begin?" (e.g., minutes, hours or days ago)      This am at the end of urination 4. SUDDEN: "Gradual or sudden onset?"     suddenly 5. PATTERN "Does the pain come and go, or is it constant?"    - If constant: "Is it getting better, staying the same, or worsening?"      (Note: Constant means the pain never goes away completely; most serious pain is constant and it progresses)     - If intermittent: "How long does it last?" "Do you have pain now?"     (Note: Intermittent means the pain goes away completely between bouts)     *No Answer* 6. SEVERITY: "How bad is the pain?"  (e.g., Scale 1-10; mild, moderate, or severe)   - MILD (1-3): doesn't interfere with normal activities, abdomen soft and not tender to touch    - MODERATE (4-7): interferes with normal activities or awakens from sleep, abdomen tender to touch    - SEVERE (8-10): excruciating pain, doubled over, unable to do any normal activities      *No Answer* 7. RECURRENT SYMPTOM: "Have you ever had this type of stomach pain before?" If Yes, ask: "When was the last  time?" and "What happened that time?"      Yes UTI 8. CAUSE: "What do you think is causing the stomach pain?"     *No Answer* 9. RELIEVING/AGGRAVATING FACTORS: "What makes it better or worse?" (e.g., movement, antacids, bowel movement)     *No Answer* 10. OTHER SYMPTOMS: "Do you have any other symptoms?" (e.g., back pain, diarrhea, fever, urination pain, vomiting)       *No Answer* 11. PREGNANCY: "Is there any chance you are pregnant?" "When was your last menstrual period?"       *No Answer*  Answer Assessment - Initial Assessment Questions 1. SYMPTOM: "What's the main symptom you're concerned about?" (e.g., frequency, incontinence)     Bring and bladder pressure 2. ONSET: "When did the  bladder pressure  start?"     This am 3. PAIN: "Is there any pain?" If Yes, ask: "How bad is it?" (Scale: 1-10; mild, moderate, severe)     Yes 8/10 4. CAUSE: "What do you think is causing the symptoms?"     UTI 5. OTHER SYMPTOMS: "Do you have any other symptoms?" (e.g., fever, flank pain, blood in urine, pain with urination)     Blood on toilet paper  Protocols used: Abdominal Pain - Female-A-AH, Urinary Symptoms-A-AH

## 2022-04-25 ENCOUNTER — Ambulatory Visit: Payer: Self-pay

## 2022-04-25 NOTE — Telephone Encounter (Signed)
  Chief Complaint: Vaginal discharge Symptoms: White itchy off smell discharge Frequency: Last Thursday Pertinent Negatives: Patient denies bumps Disposition: [] ED /[x] Urgent Care (no appt availability in office) / [] Appointment(In office/virtual)/ []  [facility]/ [] Home Care/ [] Refused Recommended Disposition /[] [facility]/ []  Follow-up with PCP Additional Notes: PT has had a white off smelling itchy discharge since Thursday. PT does not want to try home care. PT is concerned that this may be a STI.   PT is aslo unhappy that she is never able to get an appointment in office and is sent to [facility].    Summary: std symptoms   PT  thinks she is having symptoms of an STD and would like to see a provider asap.  No appts until sept.   [phone]      Reason for Disposition  Patient is worried they have a sexually transmitted infection (STI)  Answer Assessment - Initial Assessment Questions 1. DISCHARGE: "Describe the discharge." (e.g., white, yellow, green, gray, foamy, cottage cheese-like)     White 2. ODOR: "Is there a bad odor?"     yes 3. ONSET: "When did the discharge begin?"     Last thursday 4. RASH: "Is there a rash in the genital area?" If Yes, ask: "Describe it." (e.g., redness, blisters, sores, bumps)     no 5. ABDOMEN PAIN: "Are you having any abdomen pain?" If Yes, ask: "What does it feel like? " (e.g., crampy, dull, intermittent, constant)      no 6. ABDOMEN PAIN SEVERITY: If present, ask: "How bad is it?" (e.g., Scale 1-10; mild, moderate, or severe)   - MILD (1-3): Doesn't interfere with normal activities, abdomen soft and not tender to touch.    - MODERATE (4-7): Interferes with normal activities or awakens from sleep, abdomen tender to touch.    - SEVERE (8-10): Excruciating pain, doubled over, unable to do any normal activities. (R/O peritonitis)      Yes - comes and goes 6/10 7. CAUSE: "What do you think is causing the discharge?"  "Have you had the same problem before? What happened then?"     BV, yeast 8. OTHER SYMPTOMS: "Do you have any other symptoms?" (e.g., fever, itching, vaginal bleeding, pain with urination, injury to genital area, vaginal foreign body)      Itchy 9. PREGNANCY: "Is there any chance you are pregnant?" "When was your last menstrual period?"     no  Protocols used: Vaginal Discharge-A-AH

## 2022-04-26 NOTE — Telephone Encounter (Signed)
Pt was called and a VM was left informing patient that she can come to thee office tomorrow for a vaginal swab. Pt can be placed on the nurse schedule.

## 2022-06-21 ENCOUNTER — Other Ambulatory Visit: Payer: Self-pay | Admitting: Family Medicine

## 2022-06-21 DIAGNOSIS — J4540 Moderate persistent asthma, uncomplicated: Secondary | ICD-10-CM

## 2022-06-21 MED ORDER — ALBUTEROL SULFATE HFA 108 (90 BASE) MCG/ACT IN AERS
2.0000 | INHALATION_SPRAY | Freq: Four times a day (QID) | RESPIRATORY_TRACT | 0 refills | Status: DC | PRN
Start: 2022-06-21 — End: 2022-11-17

## 2022-06-21 NOTE — Telephone Encounter (Signed)
Requested medication (s) are due for refill today -expired Rx  Requested medication (s) are on the active medication list -yes  Future visit scheduled -no  Last refill: 05/11/21 18g 3RF  Notes to clinic: expired Rx- request sent for review   Requested Prescriptions  Pending Prescriptions Disp Refills   albuterol (VENTOLIN HFA) 108 (90 Base) MCG/ACT inhaler 18 g 3    Sig: INHALE 2 PUFFS INTO THE LUNGS EVERY 6 (SIX) HOURS AS NEEDED FOR WHEEZING OR SHORTNESS OF BREATH.     Pulmonology:  Beta Agonists 2 Passed - 06/21/2022 12:30 PM      Passed - Last BP in normal range    BP Readings from Last 1 Encounters:  09/20/21 117/76         Passed - Last Heart Rate in normal range    Pulse Readings from Last 1 Encounters:  09/20/21 73         Passed - Valid encounter within last 12 months    Recent Outpatient Visits           9 months ago Pelvic pain   [place], Vernia Buff, NP   1 year ago Pelvic pain   [facility] Charlott Rakes, MD   1 year ago Screen for STD (sexually transmitted disease)   [place] [facility], [place], [place]   1 year ago Vaginal discharge   [place], MD   2 years ago [place], Vernia Buff, NP                 Requested Prescriptions  Pending Prescriptions Disp Refills   albuterol (VENTOLIN HFA) 108 (90 Base) MCG/ACT inhaler 18 g 3    Sig: INHALE 2 PUFFS INTO THE LUNGS EVERY 6 (SIX) HOURS AS NEEDED FOR WHEEZING OR SHORTNESS OF BREATH.     Pulmonology:  Beta Agonists 2 Passed - 06/21/2022 12:30 PM      Passed - Last BP in normal range    BP Readings from Last 1 Encounters:  09/20/21 117/76         Passed - Last Heart Rate in normal range    Pulse Readings from Last 1 Encounters:  09/20/21 73         Passed - Valid encounter within last 12 months    Recent  Outpatient Visits           9 months ago Pelvic pain   [place], Vernia Buff, NP   1 year ago Pelvic pain    [facility] Charlott Rakes, MD   1 year ago Screen for STD (sexually transmitted disease)   [place] [facility], [place], [place]   1 year ago Vaginal discharge   [place], MD   2 years ago [place], Vernia Buff, NP

## 2022-06-21 NOTE — Telephone Encounter (Signed)
Medication Refill - Medication:  albuterol (VENTOLIN HFA) 108 (90 Base) MCG/ACT inhaler (Expired)  Has the patient contacted their pharmacy? No.  Preferred Pharmacy (with phone number or street name):  [facility] [place], [place] [facility] Phone:  [phone]  Fax:  [phone]     Has the patient been seen for an appointment in the last year OR does the patient have an upcoming appointment? Yes.    Agent: Please be advised that RX refills may take up to 3 business days. We ask that you follow-up with your pharmacy.

## 2022-06-23 ENCOUNTER — Other Ambulatory Visit: Payer: Self-pay | Admitting: Family Medicine

## 2022-06-23 DIAGNOSIS — J4540 Moderate persistent asthma, uncomplicated: Secondary | ICD-10-CM

## 2022-06-23 NOTE — Telephone Encounter (Signed)
Medication Refill - Medication: albuterol (PROVENTIL) (2.5 MG/3ML) 0.083% nebulizer solution  Has the patient contacted their pharmacy? Yes.   Rx is expired.    (Agent: If yes, when and what did the pharmacy advise?)  Preferred Pharmacy (with phone number or street name):  [facility] [place] [address] [facility]  [address] [place] [place] [address]  Phone: [phone] Fax: [phone]  Hours: Not open 24 hours   Has the patient been seen for an appointment in the last year OR does the patient have an upcoming appointment? Yes.    Agent: Please be advised that RX refills may take up to 3 business days. We ask that you follow-up with your pharmacy.

## 2022-06-23 NOTE — Telephone Encounter (Signed)
Requested medication (s) are due for refill today: unsure  Requested medication (s) are on the active medication list: yes  Last refill:  05/11/22  Future visit scheduled: yes  Notes to clinic:  Unable to refill per protocol, cannot delegate. Unsure of dosage needed     Requested Prescriptions  Pending Prescriptions Disp Refills   albuterol (PROVENTIL) (2.5 MG/3ML) 0.083% nebulizer solution 150 mL 3    Sig: Take 3 mLs (2.5 mg total) by nebulization every 6 (six) hours as needed for wheezing or shortness of breath.     Pulmonology:  Beta Agonists 2 Passed - 06/23/2022  1:11 PM      Passed - Last BP in normal range    BP Readings from Last 1 Encounters:  09/20/21 117/76         Passed - Last Heart Rate in normal range    Pulse Readings from Last 1 Encounters:  09/20/21 73         Passed - Valid encounter within last 12 months    Recent Outpatient Visits           9 months ago Pelvic pain   [place], Vernia Buff, NP   1 year ago Pelvic pain   [facility] Charlott Rakes, MD   1 year ago Screen for STD (sexually transmitted disease)   [place] [facility]ers, [place], [place]   1 year ago Vaginal discharge   [place], MD   2 years ago [place], Vernia Buff, NP

## 2022-06-24 MED ORDER — ALBUTEROL SULFATE (2.5 MG/3ML) 0.083% IN NEBU: 2.5000 mg | INHALATION_SOLUTION | Freq: Four times a day (QID) | RESPIRATORY_TRACT | 0 refills | Status: DC | PRN

## 2022-07-22 ENCOUNTER — Ambulatory Visit: Payer: Self-pay

## 2022-07-22 ENCOUNTER — Telehealth: Payer: BLUE CROSS/BLUE SHIELD | Admitting: Physician Assistant

## 2022-07-22 DIAGNOSIS — N760 Acute vaginitis: Secondary | ICD-10-CM | POA: Diagnosis not present

## 2022-07-22 DIAGNOSIS — B9689 Other specified bacterial agents as the cause of diseases classified elsewhere: Secondary | ICD-10-CM | POA: Diagnosis not present

## 2022-07-22 MED ORDER — METRONIDAZOLE 500 MG PO TABS: 500.0000 mg | ORAL_TABLET | Freq: Two times a day (BID) | ORAL | 0 refills | Status: AC

## 2022-07-22 NOTE — Telephone Encounter (Signed)
Message from Crist Infante sent at 07/22/2022 10:15 AM EST  Summary: poss BV   Pt states she has BV and has been treated here in the past.  Pt wants to know if something can be called in for her? Pt has odor and discharge, please advise.        Called pt and unable to LM , mailbox full.

## 2022-07-22 NOTE — Telephone Encounter (Signed)
  Chief Complaint: Vaginal discharge Symptoms: discharge with slight odor  Frequency: since last week  Pertinent Negatives: Patient denies pain with urination  Disposition: [] ED /[] Urgent Care (no appt availability in office) / [] Appointment(In office/virtual)/ [x]  [facility]/ [] Home Care/ [] Refused Recommended Disposition /[] [facility]/ []  Follow-up with PCP Additional Notes: pt asking for medication for possible BV since she had it previously. Advised no appt and could send message but not guarantee provider will send in without visit. Recommended virtual UC and scheduled appt for today at 1700.       Summary: poss BV     Pt states she has BV and has been treated here in the past.  Pt wants to know if something can be called in for her? Pt has odor and discharge, please advise.       Reason for Disposition  Bad smelling vaginal discharge  Answer Assessment - Initial Assessment Questions 1. DISCHARGE: "Describe the discharge." (e.g., white, yellow, green, gray, foamy, cottage cheese-like)     Discharge  2. ODOR: "Is there a bad odor?"     Slight odor  3. ONSET: "When did the discharge begin?"     Last week  5. ABDOMEN PAIN: "Are you having any abdomen pain?" If Yes, ask: "What does it feel like? " (e.g., crampy, dull, intermittent, constant)      no 7. CAUSE: "What do you think is causing the discharge?" "Have you had the same problem before? What happened then?"     BV 8. OTHER SYMPTOMS: "Do you have any other symptoms?" (e.g., fever, itching, vaginal bleeding, pain with urination, injury to genital area, vaginal foreign body)  Protocols used: Vaginal Discharge-A-AH

## 2022-07-22 NOTE — Telephone Encounter (Signed)
Summary: poss BV   Pt states she has BV and has been treated here in the past.  Pt wants to know if something can be called in for her? Pt has odor and discharge, please advise.      Called pt  - mail box is full

## 2022-07-22 NOTE — Patient Instructions (Signed)
Lauren Terry, thank you for joining Mar Daring, PA-C for today's virtual visit.  While this provider is not your primary care provider (PCP), if your PCP is located in our provider database this encounter information will be shared with them immediately following your visit.   [place] account gives you access to today's visit and all your visits, tests, and labs performed at [facility] " click here if you don't have a [place] account or go to [email or web address]  Consent: (Patient) Lauren Terry provided verbal consent for this virtual visit at the beginning of the encounter.  Current Medications:  Current Outpatient Medications:    metroNIDAZOLE (FLAGYL) 500 MG tablet, Take 1 tablet (500 mg total) by mouth 2 (two) times daily for 7 days., Disp: 14 tablet, Rfl: 0   acetaminophen (TYLENOL) 325 MG tablet, Take 650 mg by mouth every 6 (six) hours as needed for mild pain or headache., Disp: , Rfl:    albuterol (PROVENTIL) (2.5 MG/3ML) 0.083% nebulizer solution, Take 3 mLs (2.5 mg total) by nebulization every 6 (six) hours as needed for wheezing or shortness of breath., Disp: 150 mL, Rfl: 0   albuterol (VENTOLIN HFA) 108 (90 Base) MCG/ACT inhaler, INHALE 2 PUFFS INTO THE LUNGS EVERY 6 (SIX) HOURS AS NEEDED FOR WHEEZING OR SHORTNESS OF BREATH., Disp: 18 g, Rfl: 0   budesonide-formoterol (SYMBICORT) 80-4.5 MCG/ACT inhaler, INHALE 2 PUFFS INTO THE LUNGS 2 (TWO) TIMES DAILY., Disp: 10.2 g, Rfl: 0   montelukast (SINGULAIR) 10 MG tablet, TAKE 1 TABLET (10 MG TOTAL) BY MOUTH AT BEDTIME., Disp: 30 tablet, Rfl: 4   Pseudoeph-Doxylamine-DM-APAP (NYQUIL PO), Take 1 capsule by mouth at bedtime as needed (congestion/sleep)., Disp: , Rfl:    Medications ordered in this encounter:  Meds ordered this encounter  Medications   metroNIDAZOLE (FLAGYL) 500 MG tablet    Sig: Take 1 tablet (500 mg total) by mouth 2 (two) times daily for 7 days.    Dispense:   14 tablet    Refill:  0    Order Specific Question:   Supervising Provider    Answer:   Chase Picket [id number]     *If you need refills on other medications prior to your next appointment, please contact your pharmacy*  Follow-Up: Call back or seek an in-person evaluation if the symptoms worsen or if the condition fails to improve as anticipated.  [facility] [phone]  Other Instructions  Bacterial Vaginosis  Bacterial vaginosis is an infection that occurs when the normal balance of bacteria in the vagina changes. This change is caused by an overgrowth of certain bacteria in the vagina. Bacterial vaginosis is the most common vaginal infection among females aged 75 to 46 years. This condition increases the risk of sexually transmitted infections (STIs). Treatment can help reduce this risk. Treatment is very important for pregnant women because this condition can cause babies to be born early (prematurely) or at a low birth weight. What are the causes? This condition is caused by an increase in harmful bacteria that are normally present in small amounts in the vagina. However, the exact reason this condition develops is not known. You cannot get bacterial vaginosis from toilet seats, bedding, swimming pools, or contact with objects around you. What increases the risk? The following factors may make you more likely to develop this condition: Having a new sexual partner or multiple sexual partners, or having unprotected sex. Douching. Having an intrauterine device (IUD). Smoking. Abusing drugs  and alcohol. This may lead to riskier sexual behavior. Taking certain antibiotic medicines. Being pregnant. What are the signs or symptoms? Some women with this condition have no symptoms. Symptoms may include: Wallace Cullens or white vaginal discharge. The discharge can be watery or foamy. A fish-like odor with discharge, especially after sex or during menstruation. Itching in and  around the vagina. Burning or pain with urination. How is this diagnosed? This condition is diagnosed based on: Your medical history. A physical exam of the vagina. Checking a sample of vaginal fluid for harmful bacteria or abnormal cells. How is this treated? This condition is treated with antibiotic medicines. These may be given as a pill, a vaginal cream, or a medicine that is put into the vagina (suppository). If the condition comes back after treatment, a second round of antibiotics may be needed. Follow these instructions at home: Medicines Take or apply over-the-counter and prescription medicines only as told by your health care provider. Take or apply your antibiotic medicine as told by your health care provider. Do not stop using the antibiotic even if you start to feel better. General instructions If you have a female sexual partner, tell her that you have a vaginal infection. She should follow up with her health care provider. If you have a female sexual partner, he does not need treatment. Avoid sexual activity until you finish treatment. Drink enough fluid to keep your urine pale yellow. Keep the area around your vagina and rectum clean. Wash the area daily with warm water. Wipe yourself from front to back after using the toilet. If you are breastfeeding, talk to your health care provider about continuing breastfeeding during treatment. Keep all follow-up visits. This is important. How is this prevented? Self-care Do not douche. Wash the outside of your vagina with warm water only. Wear cotton or cotton-lined underwear. Avoid wearing tight pants and pantyhose, especially during the summer. Safe sex Use protection when having sex. This includes: Using condoms. Using dental dams. This is a thin layer of a material made of latex or polyurethane that protects the mouth during oral sex. Limit the number of sexual partners. To help prevent bacterial vaginosis, it is best to have  sex with just one partner (monogamous relationship). Make sure you and your sexual partner are tested for STIs. Drugs and alcohol Do not use any products that contain nicotine or tobacco. These products include cigarettes, chewing tobacco, and vaping devices, such as e-cigarettes. If you need help quitting, ask your health care provider. Do not use drugs. Do not drink alcohol if: Your health care provider tells you not to do this. You are pregnant, may be pregnant, or are planning to become pregnant. If you drink alcohol: Limit how much you have to 0-1 drink a day. Be aware of how much alcohol is in your drink. In the [place] one drink equals one 12 oz bottle of beer (355 mL), one 5 oz glass of wine (148 mL), or one 1 oz glass of hard liquor (44 mL). Where to find more information Centers for Disease Control and Prevention: FootballExhibition.com.br American Sexual Health Association (ASHA): www.ashastd.org U.S. Department of Health and Health and safety inspector, Office on Women's Health: http://hoffman.com/ Contact a health care provider if: Your symptoms do not improve, even after treatment. You have more discharge or pain when urinating. You have a fever or chills. You have pain in your abdomen or pelvis. You have pain during sex. You have vaginal bleeding between menstrual periods. Summary Bacterial vaginosis is a  vaginal infection that occurs when the normal balance of bacteria in the vagina changes. It results from an overgrowth of certain bacteria. This condition increases the risk of sexually transmitted infections (STIs). Getting treated can help reduce this risk. Treatment is very important for pregnant women because this condition can cause babies to be born early (prematurely) or at low birth weight. This condition is treated with antibiotic medicines. These may be given as a pill, a vaginal cream, or a medicine that is put into the vagina (suppository). This information is not intended to replace  advice given to you by your health care provider. Make sure you discuss any questions you have with your health care provider. Document Revised: 02/27/2020 Document Reviewed: 02/27/2020 Elsevier Patient Education  Kevin.    If you have been instructed to have an in-person evaluation today at a local Urgent Care facility, please use the link below. It will take you to a list of all of our available [facility], including address, phone number and hours of operation. Please do not delay care.  [facility]  If you or a family member do not have a primary care provider, use the link below to schedule a visit and establish care. When you choose a [place] primary care physician or advanced practice provider, you gain a long-term partner in health. Find a Primary Care Provider  Learn more about [place]'s in-office and virtual care options: [place] [facility]

## 2022-07-22 NOTE — Progress Notes (Signed)
Virtual Visit Consent   Lauren Terry, you are scheduled for a virtual visit with a [place] provider today. Just as with appointments in the office, your consent must be obtained to participate. Your consent will be active for this visit and any virtual visit you may have with one of our providers in the next 365 days. If you have a MyChart account, a copy of this consent can be sent to you electronically.  As this is a virtual visit, video technology does not allow for your provider to perform a traditional examination. This may limit your provider's ability to fully assess your condition. If your provider identifies any concerns that need to be evaluated in person or the need to arrange testing (such as labs, EKG, etc.), we will make arrangements to do so. Although advances in technology are sophisticated, we cannot ensure that it will always work on either your end or our end. If the connection with a video visit is poor, the visit may have to be switched to a telephone visit. With either a video or telephone visit, we are not always able to ensure that we have a secure connection.  By engaging in this virtual visit, you consent to the provision of healthcare and authorize for your insurance to be billed (if applicable) for the services provided during this visit. Depending on your insurance coverage, you may receive a charge related to this service.  I need to obtain your verbal consent now. Are you willing to proceed with your visit today? Hayleigh Bawa has provided verbal consent on 07/22/2022 for a virtual visit (video or telephone). Margaretann Loveless, PA-C  Date: 07/22/2022 5:03 PM  Virtual Visit via Video Note   I, Margaretann Loveless, connected with  Lauren Terry  ([id number], 06/29/1986) on 07/22/22 at  5:00 PM EST by a video-enabled telemedicine application and verified that I am speaking with the correct person using two identifiers.  Location: Patient: Virtual Visit  Location Patient: Mobile Provider: Virtual Visit Location Provider: Home Office   I discussed the limitations of evaluation and management by telemedicine and the availability of in person appointments. The patient expressed understanding and agreed to proceed.    History of Present Illness: Lauren Terry is a 36 y.o. who identifies as a female who was assigned female at birth, and is being seen today for vaginal discharge.  HPI: Vaginal Discharge The patient's primary symptoms include a genital odor and vaginal discharge. The patient's pertinent negatives include no genital itching or genital rash. This is a new problem. The current episode started in the past 7 days. The problem occurs constantly. The problem has been gradually worsening. Pertinent negatives include no abdominal pain, back pain, chills, diarrhea, dysuria, fever, flank pain, frequency, nausea or vomiting. The vaginal discharge was white, thick and malodorous. There has been no bleeding. Nothing aggravates the symptoms. She has tried nothing for the symptoms. The treatment provided no relief.     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   Moderate persistent asthma 05/30/2018    Allergies: No Known Allergies Medications:  Current Outpatient Medications:    metroNIDAZOLE (FLAGYL) 500 MG tablet, Take 1 tablet (500 mg total) by mouth 2 (two) times daily for 7 days., Disp: 14 tablet, Rfl: 0   acetaminophen (TYLENOL) 325 MG tablet, Take 650 mg by mouth every 6 (six) hours as  needed for mild pain or headache., Disp: , Rfl:    albuterol (PROVENTIL) (2.5 MG/3ML) 0.083% nebulizer solution, Take 3 mLs (2.5 mg total) by nebulization every 6 (six) hours as needed for wheezing or shortness of breath., Disp: 150 mL, Rfl: 0   albuterol (VENTOLIN HFA) 108 (90 Base) MCG/ACT inhaler, INHALE 2 PUFFS INTO THE  LUNGS EVERY 6 (SIX) HOURS AS NEEDED FOR WHEEZING OR SHORTNESS OF BREATH., Disp: 18 g, Rfl: 0   budesonide-formoterol (SYMBICORT) 80-4.5 MCG/ACT inhaler, INHALE 2 PUFFS INTO THE LUNGS 2 (TWO) TIMES DAILY., Disp: 10.2 g, Rfl: 0   montelukast (SINGULAIR) 10 MG tablet, TAKE 1 TABLET (10 MG TOTAL) BY MOUTH AT BEDTIME., Disp: 30 tablet, Rfl: 4   Pseudoeph-Doxylamine-DM-APAP (NYQUIL PO), Take 1 capsule by mouth at bedtime as needed (congestion/sleep)., Disp: , Rfl:   Observations/Objective: Patient is well-developed, well-nourished in no acute distress.  Resting comfortably  Head is normocephalic, atraumatic.  No labored breathing.  Speech is clear and coherent with logical content.  Patient is alert and oriented at baseline.    Assessment and Plan: 1. BV (bacterial vaginosis) - metroNIDAZOLE (FLAGYL) 500 MG tablet; Take 1 tablet (500 mg total) by mouth 2 (two) times daily for 7 days.  Dispense: 14 tablet; Refill: 0  - Symptoms consistent with BV - Metronidazole prescribed - Limit bubble baths, scented lotions/soaps/detergents - Limit tight fitting clothing - Seek on person evaluation if not improving or if symptoms worsen   Follow Up Instructions: I discussed the assessment and treatment plan with the patient. The patient was provided an opportunity to ask questions and all were answered. The patient agreed with the plan and demonstrated an understanding of the instructions.  A copy of instructions were sent to the patient via MyChart unless otherwise noted below.    The patient was advised to call back or seek an in-person evaluation if the symptoms worsen or if the condition fails to improve as anticipated.  Time:  I spent 8 minutes with the patient via telehealth technology discussing the above problems/concerns.    Mar Daring, PA-C

## 2022-11-17 ENCOUNTER — Other Ambulatory Visit: Payer: Self-pay | Admitting: Family Medicine

## 2022-11-17 DIAGNOSIS — J4540 Moderate persistent asthma, uncomplicated: Secondary | ICD-10-CM

## 2022-11-17 NOTE — Telephone Encounter (Signed)
Requested Prescriptions  Pending Prescriptions Disp Refills   VENTOLIN HFA 108 (90 Base) MCG/ACT inhaler [Pharmacy Med Name: VENTOLIN HFA INH W/DOS CTR 200PUFFS] 18 g 0    Sig: INHALE 2 PUFFS INTO THE LUNGS EVERY 6 HOURS AS NEEDED FOR WHEEZING OR SHORTNESS OF BREATH     Pulmonology:  Beta Agonists 2 Failed - 11/17/2022  2:38 PM      Failed - Valid encounter within last 12 months    Recent Outpatient Visits           1 year ago Pelvic pain   [place], Vernia Buff, NP   1 year ago Pelvic pain   [facility] Charlott Rakes, MD   2 years ago Screen for STD (sexually transmitted disease)   [place], [place], [place]   2 years ago Vaginal discharge   [place], MD   2 years ago [place], [place] W, NP              Passed - Last BP in normal range    BP Readings from Last 1 Encounters:  09/20/21 117/76         Passed - Last Heart Rate in normal range    Pulse Readings from Last 1 Encounters:  09/20/21 73

## 2022-11-17 NOTE — Telephone Encounter (Signed)
Called patient to schedule appt for medication refills. No answer, LVMTCB [phone]

## 2022-11-17 NOTE — Telephone Encounter (Signed)
Requested medication (s) are due for refill today: yes  Requested medication (s) are on the active medication list: yes  Last refill:  06/21/22 #18 g 0 refills  Future visit scheduled: no   Notes to clinic:  duplicate request. No refills remain. Overdue for appt. Called patient to schedule appt no answer, LVMTCB. Do you want to refill Rx?     Requested Prescriptions  Pending Prescriptions Disp Refills   albuterol (VENTOLIN HFA) 108 (90 Base) MCG/ACT inhaler [Pharmacy Med Name: ALBUTEROL HFA INH(200 PUFFS) 18GM] 18 g 0    Sig: INHALE 2 PUFFS INTO THE LUNGS EVERY 6 HOURS AS NEEDED FOR WHEEZING OR SHORTNESS OF BREATH     Pulmonology:  Beta Agonists 2 Failed - 11/17/2022  2:38 PM      Failed - Valid encounter within last 12 months    Recent Outpatient Visits           1 year ago Pelvic pain   [place], Vernia Buff, NP   1 year ago Pelvic pain   [place], MD   2 years ago Screen for STD (sexually transmitted disease)   [place] Mayers, [place], [place]   2 years ago Vaginal discharge   [place], MD   2 years ago [place], [place] W, NP              Passed - Last BP in normal range    BP Readings from Last 1 Encounters:  09/20/21 117/76         Passed - Last Heart Rate in normal range    Pulse Readings from Last 1 Encounters:  09/20/21 73

## 2023-01-09 ENCOUNTER — Other Ambulatory Visit: Payer: Self-pay | Admitting: Family Medicine

## 2023-01-09 DIAGNOSIS — J4540 Moderate persistent asthma, uncomplicated: Secondary | ICD-10-CM

## 2023-01-09 NOTE — Telephone Encounter (Signed)
Medication Refill - Medication: albuterol (PROVENTIL) (2.5 MG/3ML) 0.083% nebulizer solution [[id number]]   Has the patient contacted their pharmacy? Yes.    (Agent: If yes, when and what did the pharmacy advise?) Contact PCP   Preferred Pharmacy (with phone number or street name): [id number] - [place], [place] - [address] AT [facility]   Has the patient been seen for an appointment in the last year OR does the patient have an upcoming appointment? Yes.    Agent: Please be advised that RX refills may take up to 3 business days. We ask that you follow-up with your pharmacy.

## 2023-01-10 ENCOUNTER — Emergency Department (HOSPITAL_COMMUNITY): Payer: Medicaid Other

## 2023-01-10 ENCOUNTER — Emergency Department (HOSPITAL_COMMUNITY)
Admission: EM | Admit: 2023-01-10 | Discharge: 2023-01-10 | Disposition: A | Discharge status: Home / Self Care | Payer: Medicaid Other | Attending: Emergency Medicine | Admitting: Emergency Medicine

## 2023-01-10 ENCOUNTER — Other Ambulatory Visit: Payer: Self-pay

## 2023-01-10 ENCOUNTER — Encounter (HOSPITAL_COMMUNITY): Payer: Self-pay

## 2023-01-10 DIAGNOSIS — D72829 Elevated white blood cell count, unspecified: Secondary | ICD-10-CM | POA: Diagnosis not present

## 2023-01-10 DIAGNOSIS — E876 Hypokalemia: Secondary | ICD-10-CM | POA: Insufficient documentation

## 2023-01-10 DIAGNOSIS — Z7951 Long term (current) use of inhaled steroids: Secondary | ICD-10-CM | POA: Diagnosis not present

## 2023-01-10 DIAGNOSIS — Z20822 Contact with and (suspected) exposure to covid-19: Secondary | ICD-10-CM | POA: Insufficient documentation

## 2023-01-10 DIAGNOSIS — J4541 Moderate persistent asthma with (acute) exacerbation: Secondary | ICD-10-CM | POA: Insufficient documentation

## 2023-01-10 DIAGNOSIS — Z7952 Long term (current) use of systemic steroids: Secondary | ICD-10-CM | POA: Diagnosis not present

## 2023-01-10 DIAGNOSIS — R0602 Shortness of breath: Secondary | ICD-10-CM | POA: Diagnosis present

## 2023-01-10 LAB — BASIC METABOLIC PANEL
Anion gap: 9 (ref 5–15)
BUN: 10 mg/dL (ref 6–20)
CO2: 22 mmol/L (ref 22–32)
Calcium: 8.6 mg/dL — ABNORMAL LOW (ref 8.9–10.3)
Chloride: 107 mmol/L (ref 98–111)
Creatinine, Ser: 0.98 mg/dL (ref 0.44–1.00)
GFR, Estimated: >60 mL/min (ref >60)
Glucose, Bld: 120 mg/dL — ABNORMAL HIGH (ref 70–99)
Potassium: 3.3 mmol/L — ABNORMAL LOW (ref 3.5–5.1)
Sodium: 138 mmol/L (ref 135–145)

## 2023-01-10 LAB — CBC
HCT: 40.3 % (ref 36.0–46.0)
Hemoglobin: 12.8 g/dL (ref 12.0–15.0)
MCH: 27.9 pg (ref 26.0–34.0)
MCHC: 31.8 g/dL (ref 30.0–36.0)
MCV: 88 fL (ref 80.0–100.0)
Platelets: 340 10*3/uL (ref 150–400)
RBC: 4.58 MIL/uL (ref 3.87–5.11)
RDW: 14.4 % (ref 11.5–15.5)
WBC: 12 10*3/uL — ABNORMAL HIGH (ref 4.0–10.5)
nRBC: 0 % (ref 0.0–0.2)

## 2023-01-10 LAB — RESP PANEL BY RT-PCR (RSV, FLU A&B, COVID)  RVPGX2
Influenza A by PCR: NEGATIVE
Influenza B by PCR: NEGATIVE
Resp Syncytial Virus by PCR: NEGATIVE
SARS Coronavirus 2 by RT PCR: NEGATIVE

## 2023-01-10 MED ORDER — METHYLPREDNISOLONE SODIUM SUCC 125 MG IJ SOLR
125.0000 mg | Freq: Every day | INTRAMUSCULAR | Status: DC
  Filled 2023-01-10: qty 2

## 2023-01-10 MED ORDER — MAGNESIUM SULFATE 2 GM/50ML IV SOLN
2.0000 g | Freq: Once | INTRAVENOUS | Status: AC
  Administered 2023-01-10: 2 g via INTRAVENOUS
  Filled 2023-01-10: qty 50

## 2023-01-10 MED ORDER — PREDNISONE 20 MG PO TABS: 40.0000 mg | ORAL_TABLET | Freq: Every day | ORAL | 0 refills | Status: DC

## 2023-01-10 MED ORDER — METHYLPREDNISOLONE SODIUM SUCC 125 MG IJ SOLR
125.0000 mg | Freq: Once | INTRAMUSCULAR | Status: AC
  Administered 2023-01-10: 125 mg via INTRAVENOUS

## 2023-01-10 MED ORDER — ALBUTEROL SULFATE HFA 108 (90 BASE) MCG/ACT IN AERS: 1.0000 | INHALATION_SPRAY | Freq: Four times a day (QID) | RESPIRATORY_TRACT | 1 refills | Status: DC | PRN

## 2023-01-10 MED ORDER — POTASSIUM CHLORIDE CRYS ER 20 MEQ PO TBCR
40.0000 meq | EXTENDED_RELEASE_TABLET | Freq: Once | ORAL | Status: AC
  Administered 2023-01-10: 40 meq via ORAL
  Filled 2023-01-10: qty 2

## 2023-01-10 MED ORDER — IPRATROPIUM-ALBUTEROL 0.5-2.5 (3) MG/3ML IN SOLN
3.0000 mL | Freq: Once | RESPIRATORY_TRACT | Status: AC
  Administered 2023-01-10: 3 mL via RESPIRATORY_TRACT
  Filled 2023-01-10: qty 3

## 2023-01-10 NOTE — Discharge Instructions (Addendum)
Please follow-up with your primary doctor, return if your symptoms worsen despite treatment

## 2023-01-10 NOTE — ED Triage Notes (Signed)
SOB x a few hours. Hx of asthma and did neb treatment earlier but it did not help. Needs inhaler refill. Also c/o slight cough. Denies fevers/chills. Tripod position and wheezing noted.

## 2023-01-10 NOTE — Telephone Encounter (Signed)
Requested Prescriptions  Pending Prescriptions Disp Refills   albuterol (PROVENTIL) (2.5 MG/3ML) 0.083% nebulizer solution 150 mL 0    Sig: Take 3 mLs (2.5 mg total) by nebulization every 6 (six) hours as needed for wheezing or shortness of breath.     Pulmonology:  Beta Agonists 2 Failed - 01/09/2023  5:19 PM      Failed - Valid encounter within last 12 months    Recent Outpatient Visits           1 year ago Pelvic pain   [place] [facility] [place], Shea Stakes, NP   1 year ago Pelvic pain   [facility] Hoy Register, MD   2 years ago Screen for STD (sexually transmitted disease)   [place] [facility] [place], [place], [place]   2 years ago Vaginal discharge   [place] [facility] Hoy Register, MD   2 years ago COVID-19   [facility] [place], [place] W, NP              Passed - Last BP in normal range    BP Readings from Last 1 Encounters:  01/10/23 (!) 142/94         Passed - Last Heart Rate in normal range    Pulse Readings from Last 1 Encounters:  01/10/23 80

## 2023-01-10 NOTE — ED Provider Notes (Signed)
[place] EMERGENCY DEPARTMENT AT [facility] Provider Note   CSN: [id number] Arrival date & time: 01/10/23  0247     History  Chief Complaint  Patient presents with   Cough   Shortness of Breath    Lauren Terry is a 37 y.o. female with past medical history significant for moderate persistent asthma presents with concern for shortness of breath, asthma exacerbation for last few hours.  Patient reports that she has inhaler, she tried a nebulizer treatment earlier which did not help.  She denies fever, chills, does report that she has had a cough for the last 2 days.  She is on Singulair.   Cough Associated symptoms: shortness of breath   Shortness of Breath Associated symptoms: cough        Home Medications Prior to Admission medications   Medication Sig Start Date End Date Taking? Authorizing Provider  albuterol (VENTOLIN HFA) 108 (90 Base) MCG/ACT inhaler Inhale 1-2 puffs into the lungs every 6 (six) hours as needed for wheezing or shortness of breath. 01/10/23  Yes Kingdom Vanzanten H, PA-C  predniSONE (DELTASONE) 20 MG tablet Take 2 tablets (40 mg total) by mouth daily. 01/10/23  Yes Tramayne Sebesta H, PA-C  acetaminophen (TYLENOL) 325 MG tablet Take 650 mg by mouth every 6 (six) hours as needed for mild pain or headache.    [provider]  budesonide-formoterol (SYMBICORT) 80-4.5 MCG/ACT inhaler INHALE 2 PUFFS INTO THE LUNGS 2 (TWO) TIMES DAILY. 08/21/20 08/21/21  Mayers, Cari S, PA-C  montelukast (SINGULAIR) 10 MG tablet TAKE 1 TABLET (10 MG TOTAL) BY MOUTH AT BEDTIME. 08/21/20 08/21/21  Mayers, Cari S, PA-C  Pseudoeph-Doxylamine-DM-APAP (NYQUIL PO) Take 1 capsule by mouth at bedtime as needed (congestion/sleep).    [provider]      Allergies    Patient has no known allergies.    Review of Systems   Review of Systems  Respiratory:  Positive for cough and shortness of breath.   All other systems reviewed and are  negative.   Physical Exam Updated Vital Signs BP (!) 152/104   Pulse 79   Temp (!) 97.5 F (36.4 C) (Oral)   Resp 20   Ht 5\' 4"  (1.626 m)   Wt 82.6 kg   LMP 12/20/2022 (Approximate)   SpO2 98%   BMI 31.24 kg/m  Physical Exam Vitals and nursing note reviewed.  Constitutional:      General: She is not in acute distress.    Appearance: Normal appearance.  HENT:     Head: Normocephalic and atraumatic.  Eyes:     General:        Right eye: No discharge.        Left eye: No discharge.  Cardiovascular:     Rate and Rhythm: Normal rate and regular rhythm.     Heart sounds: No murmur heard.    No friction rub. No gallop.  Pulmonary:     Effort: Tachypnea and respiratory distress present.     Comments: Tachypnea, significant biphasic wheezing noted throughout Abdominal:     General: Bowel sounds are normal.     Palpations: Abdomen is soft.  Skin:    General: Skin is warm and dry.     Capillary Refill: Capillary refill takes less than 2 seconds.  Neurological:     Mental Status: She is alert and oriented to person, place, and time.  Psychiatric:        Mood and Affect: Mood normal.  Behavior: Behavior normal.     ED Results / Procedures / Treatments   Labs (all labs ordered are listed, but only abnormal results are displayed) Labs Reviewed  CBC - Abnormal; Notable for the following components:      Result Value   WBC 12.0 (*)    All other components within normal limits  BASIC METABOLIC PANEL - Abnormal; Notable for the following components:   Potassium 3.3 (*)    Glucose, Bld 120 (*)    Calcium 8.6 (*)    All other components within normal limits  RESP PANEL BY RT-PCR (RSV, FLU A&B, COVID)  [id number]    EKG EKG Interpretation  Date/Time:  Tuesday January 10 2023 02:59:03 EDT Ventricular Rate:  89 PR Interval:  167 QRS Duration: 90 QT Interval:  345 QTC Calculation: 420 R Axis:   70 Text Interpretation: Sinus rhythm Baseline wander in lead(s) V1 V3 V5  V6 Confirmed by Palumbo, April ([id number]) on 01/10/2023 3:00:55 AM  Radiology DG Chest 2 View  Result Date: 01/10/2023 CLINICAL DATA:  Shortness of breath. EXAM: CHEST - 2 VIEW COMPARISON:  None Available. FINDINGS: The heart size and mediastinal contours are within normal limits. Both lungs are clear. The visualized skeletal structures are unremarkable. IMPRESSION: No active cardiopulmonary disease. Electronically Signed   By: Aram Candela M.D.   On: 01/10/2023 03:22    Procedures Procedures    Medications Ordered in ED Medications  potassium chloride SA (KLOR-CON M) CR tablet 40 mEq (has no administration in time range)  ipratropium-albuterol (DUONEB) 0.5-2.5 (3) MG/3ML nebulizer solution 3 mL (3 mLs Nebulization Given 01/10/23 0301)  magnesium sulfate IVPB 2 g 50 mL (2 g Intravenous New Bag/Given 01/10/23 0322)  methylPREDNISolone sodium succinate (SOLU-MEDROL) 125 mg/2 mL injection 125 mg (125 mg Intravenous Given 01/10/23 6045)    ED Course/ Medical Decision Making/ A&P                             Medical Decision Making Amount and/or Complexity of Data Reviewed Labs: ordered. Radiology: ordered.  Risk Prescription drug management.   This patient is a 37 y.o. female  who presents to the ED for concern of cough, shob, asthma exacerbation.   Differential diagnoses prior to evaluation: The emergent differential diagnosis includes, but is not limited to,  asthma exacerbation, COPD exacerbation, acute upper respiratory infection, acute bronchitis, chronic bronchitis, interstitial lung disease, ARDS, PE, pneumonia, atypical ACS, carbon monoxide poisoning, spontaneous pneumothorax, new CHF vs CHF exacerbation, versus other . This is not an exhaustive differential.   Past Medical History / Co-morbidities: Moderate persistent asthma  Additional history: Chart reviewed. Pertinent results include: Reviewed outpatient family medicine visits, no recent emergency department visits on  file  Physical Exam: Physical exam performed. The pertinent findings include: Diffuse biphasic wheezing, tachypnea on initial evaluation, with significant improvement to mild expiratory wheezing after DuoNeb, Solu-Medrol, magnesium.  She is no longer in any respiratory distress after initial treatment.  Lab Tests/Imaging studies: I personally interpreted labs/imaging and the pertinent results include: BMP with mild hypokalemia, we will orally replete, CBC notable for mild leukocytosis, white blood cells 12.  Interpreted plain film chest x-ray shows no evidence of acute intrathoracic abnormality.. RVP negative for covid, flu. I agree with the radiologist interpretation.  Cardiac monitoring: EKG obtained and interpreted by my attending physician which shows: NSR    Medications: I ordered medication including DuoNeb, Solu-Medrol, magnesium for acute asthma exacerbation, potassium for  hypokalemia.  I have reviewed the patients home medicines and have made adjustments as needed.   Disposition: After consideration of the diagnostic results and the patients response to treatment, I feel that patient with significant improvement after treatment as above, symptoms consistent with moderate persistent asthma exacerbation.  Will discharge with steroids, asthma inhaler refill, encourage close PCP follow-up, discussed extensive return precautions.Marland Kitchen   emergency department workup does not suggest an emergent condition requiring admission or immediate intervention beyond what has been performed at this time. The plan is: as above. The patient is safe for discharge and has been instructed to return immediately for worsening symptoms, change in symptoms or any other concerns.  Final Clinical Impression(s) / ED Diagnoses Final diagnoses:  Moderate persistent asthma with exacerbation    Rx / DC Orders ED Discharge Orders          Ordered    predniSONE (DELTASONE) 20 MG tablet  Daily        01/10/23 0433     albuterol (VENTOLIN HFA) 108 (90 Base) MCG/ACT inhaler  Every 6 hours PRN        01/10/23 0433              [place] 01/10/23 0436    Palumbo, April, MD 01/10/23 0451

## 2023-03-02 ENCOUNTER — Other Ambulatory Visit: Payer: Self-pay | Admitting: Family Medicine

## 2023-03-02 NOTE — Telephone Encounter (Signed)
Requested medication (s) are due for refill today: yes  Requested medication (s) are on the active medication list: yes    Last refill: 01/10/23  8g 1 refill  Future visit scheduled no  Notes to clinic:Historical provider.   Attempted to reach pt to secure appt. Left VM  to call back.  Requested Prescriptions  Pending Prescriptions Disp Refills   albuterol (VENTOLIN HFA) 108 (90 Base) MCG/ACT inhaler 8 g 1    Sig: Inhale 1-2 puffs into the lungs every 6 (six) hours as needed for wheezing or shortness of breath.     Pulmonology:  Beta Agonists 2 Failed - 03/02/2023 10:39 AM      Failed - Last BP in normal range    BP Readings from Last 1 Encounters:  01/10/23 (!) 142/94         Failed - Valid encounter within last 12 months    Recent Outpatient Visits           1 year ago Pelvic pain   [place] [facility] [place], Shea Stakes, NP   1 year ago Pelvic pain   [place] [facility] Hoy Register, MD   2 years ago Screen for STD (sexually transmitted disease)   [place] [facility] Mayers, [place], [place]   2 years ago Vaginal discharge   [place] [facility] Hoy Register, MD   2 years ago COVID-19   [facility] [place], Shea Stakes, NP              Passed - Last Heart Rate in normal range    Pulse Readings from Last 1 Encounters:  01/10/23 80

## 2023-03-02 NOTE — Telephone Encounter (Signed)
Medication Refill - Medication: albuterol (VENTOLIN HFA) 108 (90 Base) MCG/ACT inhaler  Has the patient contacted their pharmacy? Yes.   Pt told to contact provider  Preferred Pharmacy (with phone number or street name):  [id number] [place], [place] - [address] AT [facility] Phone: [phone]  Fax: [phone]     Has the patient been seen for an appointment in the last year OR does the patient have an upcoming appointment? No.  Agent: Please be advised that RX refills may take up to 3 business days. We ask that you follow-up with your pharmacy.

## 2023-04-25 ENCOUNTER — Other Ambulatory Visit: Payer: Self-pay | Admitting: Family Medicine

## 2023-04-25 NOTE — Telephone Encounter (Signed)
Medication Refill - Medication: albuterol (VENTOLIN HFA) 108 (90 Base) MCG/ACT inhaler   Has the patient contacted their pharmacy? Yes.    Preferred Pharmacy (with phone number or street name):  [facility] #[id number] [place], [place] - [address] AT [facility] Phone: [phone]  Fax: [phone]     Has the patient been seen for an appointment in the last year OR does the patient have an upcoming appointment? Yes.    Agent: Please be advised that RX refills may take up to 3 business days. We ask that you follow-up with your pharmacy.

## 2023-04-25 NOTE — Telephone Encounter (Signed)
Medication Refill - Medication: albuterol (VENTOLIN HFA) 108 (90 Base) MCG/ACT inhaler   Has the patient contacted their pharmacy? No.   Preferred Pharmacy (with phone number or street name):  [facility] #[id number] [place], [place] - [address] AT [facility] Phone: [phone]  Fax: [phone]     Has the patient been seen for an appointment in the last year OR does the patient have an upcoming appointment? Yes.    Agent: Please be advised that RX refills may take up to 3 business days. We ask that you follow-up with your pharmacy.

## 2023-04-26 NOTE — Telephone Encounter (Signed)
Patient called, left VM to return the call to the office to schedule an OV for follow up.  ? ?

## 2023-04-26 NOTE — Telephone Encounter (Signed)
Requested medication (s) are due for refill today: Yes  Requested medication (s) are on the active medication list: Yes  Last refill:  01/10/23  Future visit scheduled: No  Notes to clinic:  Unable to refill per protocol, appointment needed.      Requested Prescriptions  Pending Prescriptions Disp Refills   albuterol (VENTOLIN HFA) 108 (90 Base) MCG/ACT inhaler 8 g 1    Sig: Inhale 1-2 puffs into the lungs every 6 (six) hours as needed for wheezing or shortness of breath.     Pulmonology:  Beta Agonists 2 Failed - 04/25/2023 10:23 AM      Failed - Last BP in normal range    BP Readings from Last 1 Encounters:  01/10/23 (!) 142/94         Failed - Valid encounter within last 12 months    Recent Outpatient Visits           1 year ago Pelvic pain   [place] [facility] [place], Shea Stakes, NP   1 year ago Pelvic pain   [place] [facility] Hoy Register, MD   2 years ago Screen for STD (sexually transmitted disease)   [place] [facility] Mayers, [place], [place]   2 years ago Vaginal discharge   [place] [facility] Hoy Register, MD   2 years ago COVID-19   [facility] [place], Shea Stakes, NP              Passed - Last Heart Rate in normal range    Pulse Readings from Last 1 Encounters:  01/10/23 80

## 2023-04-27 ENCOUNTER — Other Ambulatory Visit: Payer: Self-pay | Admitting: Family Medicine

## 2023-04-27 NOTE — Telephone Encounter (Signed)
Medication Refill - Medication: albuterol (VENTOLIN HFA) 108 (90 Base) MCG/ACT inhaler   Has the patient contacted their pharmacy? Yes.    Preferred Pharmacy (with phone number or street name):  [facility] #[id number] [place], [place] - [address] AT [facility] Phone: [phone]  Fax: [phone]     Has the patient been seen for an appointment in the last year OR does the patient have an upcoming appointment? Yes.    Agent: Please be advised that RX refills may take up to 3 business days. We ask that you follow-up with your pharmacy.

## 2023-04-27 NOTE — Telephone Encounter (Signed)
Requested medication (s) are due for refill today: yes  Requested medication (s) are on the active medication list:yes  Last refill:  01/10/23  Future visit scheduled: no  Notes to clinic:  called pt and LM on VM to call to make appt - last OV Jan 2023/advised pt if needs sooner due to sx to call back or go to mobile bus/prescriber not at this practice   Requested Prescriptions  Pending Prescriptions Disp Refills   albuterol (VENTOLIN HFA) 108 (90 Base) MCG/ACT inhaler 8 g 1    Sig: Inhale 1-2 puffs into the lungs every 6 (six) hours as needed for wheezing or shortness of breath.     Pulmonology:  Beta Agonists 2 Failed - 04/26/2023  8:32 AM      Failed - Last BP in normal range    BP Readings from Last 1 Encounters:  01/10/23 (!) 142/94         Failed - Valid encounter within last 12 months    Recent Outpatient Visits           1 year ago Pelvic pain   [place] [facility] [place], Shea Stakes, NP   1 year ago Pelvic pain   [place] [facility] Hoy Register, MD   2 years ago Screen for STD (sexually transmitted disease)   [place] [facility] Mayers, [place], [place]   2 years ago Vaginal discharge   [place] [facility] Hoy Register, MD   2 years ago COVID-19   [facility] [place], Shea Stakes, NP              Passed - Last Heart Rate in normal range    Pulse Readings from Last 1 Encounters:  01/10/23 80

## 2023-04-28 NOTE — Telephone Encounter (Signed)
Requested medications are due for refill today.  yes  Requested medications are on the active medications list.  yes  Last refill. 01/10/2023 8g 1 rf  Future visit scheduled.   yes  Notes to clinic.  Rx signed by Luther Hearing.     Requested Prescriptions  Pending Prescriptions Disp Refills   albuterol (VENTOLIN HFA) 108 (90 Base) MCG/ACT inhaler 8 g 1    Sig: Inhale 1-2 puffs into the lungs every 6 (six) hours as needed for wheezing or shortness of breath.     Pulmonology:  Beta Agonists 2 Failed - 04/27/2023  5:26 PM      Failed - Last BP in normal range    BP Readings from Last 1 Encounters:  01/10/23 (!) 142/94         Failed - Valid encounter within last 12 months    Recent Outpatient Visits           1 year ago Pelvic pain   [place] [facility] [place], Shea Stakes, NP   1 year ago Pelvic pain   [place] [facility] Hoy Register, MD   2 years ago Screen for STD (sexually transmitted disease)   [place] [facility] Mayers, Cari S, [place]   2 years ago Vaginal discharge   [place] [facility] Hoy Register, MD   2 years ago COVID-19   [facility] [place] Rigg, NP       Future Appointments             In 2 months Hoy Register, MD [facility]            Passed - Last Heart Rate in normal range    Pulse Readings from Last 1 Encounters:  01/10/23 80

## 2023-04-28 NOTE — Telephone Encounter (Signed)
Called pt and made follow up appt. 

## 2023-06-04 ENCOUNTER — Emergency Department (HOSPITAL_COMMUNITY)
Admission: EM | Admit: 2023-06-04 | Discharge: 2023-06-04 | Disposition: A | Discharge status: Home / Self Care | Payer: BC Managed Care – PPO | Attending: Emergency Medicine | Admitting: Emergency Medicine

## 2023-06-04 ENCOUNTER — Other Ambulatory Visit: Payer: Self-pay

## 2023-06-04 DIAGNOSIS — Z4802 Encounter for removal of sutures: Secondary | ICD-10-CM | POA: Diagnosis present

## 2023-06-04 DIAGNOSIS — S0101XD Laceration without foreign body of scalp, subsequent encounter: Secondary | ICD-10-CM | POA: Diagnosis not present

## 2023-06-04 DIAGNOSIS — X58XXXD Exposure to other specified factors, subsequent encounter: Secondary | ICD-10-CM | POA: Diagnosis not present

## 2023-06-04 NOTE — ED Provider Notes (Signed)
[place] EMERGENCY DEPARTMENT AT [facility] Provider Note   CSN: [id number] Arrival date & time: 06/04/23  1104     History  Chief Complaint  Patient presents with   Suture / Staple Removal    Lauren Terry is a 37 y.o. female here for suture removal reports that she had sutures put in roughly 1 month ago and had no complications in the healing process.  She reports 4 sutures after for laceration repair.  She has had no fever chills abdominal pain shortness of breath.  No concerns.   Suture / Staple Removal       Home Medications Prior to Admission medications   Medication Sig Start Date End Date Taking? Authorizing Provider  acetaminophen (TYLENOL) 325 MG tablet Take 650 mg by mouth every 6 (six) hours as needed for mild pain or headache.    [provider]  albuterol (VENTOLIN HFA) 108 (90 Base) MCG/ACT inhaler Inhale 1-2 puffs into the lungs every 6 (six) hours as needed for wheezing or shortness of breath. 01/10/23   Prosperi, Christian H, PA-C  budesonide-formoterol (SYMBICORT) 80-4.5 MCG/ACT inhaler INHALE 2 PUFFS INTO THE LUNGS 2 (TWO) TIMES DAILY. 08/21/20 08/21/21  Mayers, Cari S, PA-C  montelukast (SINGULAIR) 10 MG tablet TAKE 1 TABLET (10 MG TOTAL) BY MOUTH AT BEDTIME. 08/21/20 08/21/21  Mayers, Cari S, PA-C  predniSONE (DELTASONE) 20 MG tablet Take 2 tablets (40 mg total) by mouth daily. 01/10/23   Prosperi, Christian H, PA-C  Pseudoeph-Doxylamine-DM-APAP (NYQUIL PO) Take 1 capsule by mouth at bedtime as needed (congestion/sleep).    [provider]      Allergies    Patient has no known allergies.    Review of Systems   Review of Systems  Skin:  Positive for wound.    Physical Exam Updated Vital Signs BP (!) 147/108 (BP Location: Right Arm)   Pulse 65   Temp 98.3 F (36.8 C) (Oral)   Resp 18   Ht 5\' 4"  (1.626 m)   Wt 83 kg   BMI 31.41 kg/m  Physical Exam Vitals and nursing note reviewed.  Constitutional:       General: She is not in acute distress.    Appearance: She is not toxic-appearing.  HENT:     Head: Normocephalic and atraumatic.  Eyes:     General: No scleral icterus.    Conjunctiva/sclera: Conjunctivae normal.  Cardiovascular:     Rate and Rhythm: Normal rate and regular rhythm.     Pulses: Normal pulses.     Heart sounds: Normal heart sounds.  Pulmonary:     Effort: Pulmonary effort is normal. No respiratory distress.     Breath sounds: Normal breath sounds.  Abdominal:     General: Abdomen is flat. Bowel sounds are normal.     Palpations: Abdomen is soft.     Tenderness: There is no abdominal tenderness.  Skin:    General: Skin is warm and dry.     Findings: No lesion.     Comments: Roughly 3 cm laceration that is clean dry healing and intact.  Laceration is well-approximated and healed.  Sutures were removed without complication.  Neurological:     General: No focal deficit present.     Mental Status: She is alert and oriented to person, place, and time. Mental status is at baseline.     ED Results / Procedures / Treatments   Labs (all labs ordered are listed, but only abnormal results are displayed) Labs Reviewed -  No data to display  EKG None  Radiology No results found.  Procedures Procedures    Medications Ordered in ED Medications - No data to display  ED Course/ Medical Decision Making/ A&P                                 Medical Decision Making  This patient presents to the ED for concern of suture removal, this involves an extensive number of treatment options, and is a complaint that carries with it a high risk of complications and morbidity.  The differential diagnosis includes wound healing, poor wound healing, infection, cellulitis, I&D    Problem List / ED Course / Critical interventions / Medication management  Reporting to the emergency room for suture removal.  Sutures were placed roughly 1 month ago.  Healing has had no complications.   Clean dry and intact on physical exam very well-healed.  Sutures were removed without any complications, patient tolerated procedure.  Given reassuring findings on physical exam after removing sutures is okay to be discharged. Reevaluation of the patient after these medicines showed that the patient improved I have reviewed the patients home medicines and have made adjustments as needed   Plan  Patient was given return precautions. Patient stable for discharge at this time.  Patient educated on sx/dx and verbalized understanding of plan. Return to ER w/ new or worsening sx.          Final Clinical Impression(s) / ED Diagnoses Final diagnoses:  None    Rx / DC Orders ED Discharge Orders     None         Raford Pitcher Evalee Jefferson 06/04/23 1610    Lorre Nick, MD 06/05/23 [phone]

## 2023-06-04 NOTE — Discharge Instructions (Addendum)
In the emergency room today for suture removal.  The wound is healing well thus's were removed. Return to the emergency room if you have any new or worsening symptoms.

## 2023-06-04 NOTE — ED Triage Notes (Signed)
Pt req sutures removed from scalp. Sutures placed 08/25

## 2023-07-17 ENCOUNTER — Ambulatory Visit: Payer: BLUE CROSS/BLUE SHIELD | Admitting: Family Medicine

## 2023-07-20 ENCOUNTER — Encounter: Payer: Self-pay | Admitting: Physician Assistant

## 2023-07-20 ENCOUNTER — Encounter: Payer: Self-pay | Admitting: Family Medicine

## 2023-07-20 ENCOUNTER — Ambulatory Visit: Payer: BLUE CROSS/BLUE SHIELD | Attending: Family Medicine | Admitting: Physician Assistant

## 2023-07-20 VITALS — BP 127/80 | HR 82 | Wt 178.0 lb

## 2023-07-20 DIAGNOSIS — J4540 Moderate persistent asthma, uncomplicated: Secondary | ICD-10-CM

## 2023-07-20 DIAGNOSIS — L308 Other specified dermatitis: Secondary | ICD-10-CM | POA: Diagnosis not present

## 2023-07-20 MED ORDER — TRIAMCINOLONE ACETONIDE 0.5 % EX OINT
1.0000 | TOPICAL_OINTMENT | Freq: Two times a day (BID) | CUTANEOUS | 1 refills | Status: DC
Start: 2023-07-20 — End: 2024-03-28

## 2023-07-20 MED ORDER — ALBUTEROL SULFATE HFA 108 (90 BASE) MCG/ACT IN AERS
1.0000 | INHALATION_SPRAY | Freq: Four times a day (QID) | RESPIRATORY_TRACT | 1 refills | Status: DC | PRN
Start: 2023-07-20 — End: 2023-10-11

## 2023-07-20 MED ORDER — BUDESONIDE-FORMOTEROL FUMARATE 80-4.5 MCG/ACT IN AERO
2.0000 | INHALATION_SPRAY | Freq: Two times a day (BID) | RESPIRATORY_TRACT | 0 refills | Status: DC
Start: 2023-07-20 — End: 2024-02-01

## 2023-07-20 MED ORDER — MONTELUKAST SODIUM 10 MG PO TABS
ORAL_TABLET | Freq: Every day | ORAL | 4 refills | Status: AC
Start: 2023-07-20 — End: 2024-07-19

## 2023-07-20 NOTE — Patient Instructions (Signed)
Eczema Eczema refers to a group of skin conditions that cause skin to become rough and inflamed. Each type of eczema has different triggers, symptoms, and treatments. Eczema of any type is usually itchy. Symptoms range from mild to severe. Eczema is not spread from person to person (is not contagious). It can appear on different parts of the body at different times. One person's eczema may look different from another person's eczema. What are the causes? The exact cause of this condition is not known. However, exposure to certain environmental factors, irritants, and allergens can make the condition worse. What are the signs or symptoms? Symptoms of this condition depend on the type of eczema you have. The types include: Contact dermatitis. There are two kinds: Irritant contact dermatitis. This happens when something irritates the skin and causes a rash. Allergic contact dermatitis. This happens when your skin comes in contact with something you are allergic to (allergens). This can include poison ivy, chemicals, or medicines that were applied to your skin. Atopic dermatitis. This is a long-term (chronic) skin disease that keeps coming back (recurring). It is the most common type of eczema. Usual symptoms are a red rash and itchy, dry, scaly skin. It usually starts showing signs in infancy and can last through adulthood. Dyshidrotic eczema. This is a form of eczema on the hands and feet. It shows up as very itchy, fluid-filled blisters. It can affect people of any age but is more common before age 40. Hand eczema. This causes very itchy areas of skin on the palms and sides of the hands and fingers. This type of eczema is common in industrial jobs where you may be exposed to different types of irritants. Lichen simplex chronicus. This type of eczema occurs when a person constantly scratches one area of the body. Repeated scratching of the area leads to thickened skin (lichenification). This condition can  accompany other types of eczema. It is more common in adults but may also be seen in children. Nummular eczema. This is a common type of eczema that most often affects the lower legs and the backs of the hands. It typically causes an itchy, red, circular, crusty lesion (plaque). Scratching may become a habit and can cause bleeding. Nummular eczema occurs most often in middle-aged or older people. Seborrheic dermatitis. This is a common skin disease that mainly affects the scalp. It may also affect other oily areas of the body, such as the face, sides of the nose, eyebrows, ears, eyelids, and chest. It is marked by small scaling and redness of the skin (erythema). This can affect people of all ages. In infants, this condition is called cradle cap. Stasis dermatitis. This is a common skin disease that can cause itching, scaling, and hyperpigmentation, usually on the legs and feet. It occurs most often in people who have a condition that prevents blood from being pumped through the veins in the legs (chronic venous insufficiency). Stasis dermatitis is a chronic condition that needs long-term management. How is this diagnosed? This condition may be diagnosed based on: A physical exam of your skin. Your medical history. Skin patch tests. These tests involve using patches that contain possible allergens and placing them on your back. Your health care provider will check in a few days to see if an allergic reaction occurred. How is this treated? Treatment for eczema is based on the type of eczema you have. You may be given hydrocortisone steroid medicine or antihistamines. These can relieve itching quickly and help reduce inflammation.   These may be prescribed or purchased over the counter, depending on the strength that is needed. Follow these instructions at home: Take or apply over-the-counter and prescription medicines only as told by your health care provider. Use creams or ointments to moisturize your  skin. Do not use lotions. Learn what triggers or irritates your symptoms so you can avoid these things. Treat symptom flare-ups quickly. Do not scratch your skin. This can make your rash worse. Keep all follow-up visits. This is important. Where to find more information American Academy of Dermatology: aad.org National Eczema Association: nationaleczema.org The Society for Pediatric Dermatology: pedsderm.net Contact a health care provider if: You have severe itching, even with treatment. You scratch your skin regularly until it bleeds. Your rash looks different than usual. Your skin is painful, swollen, or more red than usual. You have a fever. Summary Eczema refers to a group of skin conditions that cause skin to become rough and inflamed. Each type has different triggers. Eczema of any type causes itching that may range from mild to severe. Treatment varies based on the type of eczema you have. Hydrocortisone steroid medicine or antihistamines can help with itching and inflammation. Protecting your skin is the best way to prevent eczema. Use creams or ointments to moisturize your skin. Avoid triggers and irritants. Treat flare-ups quickly. This information is not intended to replace advice given to you by your health care provider. Make sure you discuss any questions you have with your health care provider. Document Revised: 06/05/2020 Document Reviewed: 06/08/2020 Elsevier Patient Education  2024 Elsevier Inc.  

## 2023-07-20 NOTE — Progress Notes (Signed)
Patient ID: Lauren Terry, female   DOB: May 16, 1986, 37 y.o.   MRN: [id number]   Cori Justus, is a 37 y.o. female  [id number]  [id number]  DOB - 08-31-1986  Chief Complaint  Patient presents with   Asthma    Patient states she is needing her inhaler refilled   Eczema    Patient is having some flare ups on hands and feet. Patient stated that she was on a cream that started with a T       Subjective:   Lauren Terry is a 37 y.o. female here today for RF on triamcinolone ointment and asthma meds.  She is completely out of meds.  +wheezing but no distress. Also having flares of eczema when off asthma meds.  No fevers/cough/discolored mucus.  No there issues or concerns.    No problems updated.  ALLERGIES: No Known Allergies  PAST MEDICAL HISTORY: Past Medical History:  Diagnosis Date   Asthma     MEDICATIONS AT HOME: Prior to Admission medications   Medication Sig Start Date End Date Taking? Authorizing Provider  triamcinolone ointment (KENALOG) 0.5 % Apply 1 Application topically 2 (two) times daily. 07/20/23  Yes Saidy Ormand M, PA-C  albuterol (VENTOLIN HFA) 108 (90 Base) MCG/ACT inhaler Inhale 1-2 puffs into the lungs every 6 (six) hours as needed for wheezing or shortness of breath. 07/20/23   Anders Simmonds, PA-C  budesonide-formoterol (SYMBICORT) 80-4.5 MCG/ACT inhaler INHALE 2 PUFFS INTO THE LUNGS 2 (TWO) TIMES DAILY. 07/20/23 07/19/24  Anders Simmonds, PA-C  montelukast (SINGULAIR) 10 MG tablet TAKE 1 TABLET (10 MG TOTAL) BY MOUTH AT BEDTIME. 07/20/23 07/19/24  Nirvi Boehler, Marzella Schlein, PA-C    ROS: Neg HEENT Neg cardiac Neg GI Neg GU Neg MS Neg psych Neg neuro  Objective:   Vitals:   07/20/23 1548  BP: 127/80  Pulse: 82  SpO2: 97%  Weight: 178 lb (80.7 kg)   Exam General appearance : Awake, alert, not in any distress. Speech Clear. Not toxic looking HEENT: Atraumatic and Normocephalic Neck: Supple, no JVD. No cervical lymphadenopathy.   Chest: Good air entry bilaterally,   No rales/rhonchi but there is mild wheezing throughout.  No distress CVS: S1 S2 regular, no murmurs.  Extremities: B/L Lower Ext shows no edema, both legs are warm to touch Neurology: Awake alert, and oriented X 3, CN II-XII intact, Non focal Skin: dyshidrotic changes on hands and volar surfaces of arms  Data Review No results found for: "HGBA1C"  Assessment & Plan   1. Moderate persistent asthma without complication resume - montelukast (SINGULAIR) 10 MG tablet; TAKE 1 TABLET (10 MG TOTAL) BY MOUTH AT BEDTIME.  Dispense: 30 tablet; Refill: 4 - albuterol (VENTOLIN HFA) 108 (90 Base) MCG/ACT inhaler; Inhale 1-2 puffs into the lungs every 6 (six) hours as needed for wheezing or shortness of breath.  Dispense: 8 g; Refill: 1 - budesonide-formoterol (SYMBICORT) 80-4.5 MCG/ACT inhaler; INHALE 2 PUFFS INTO THE LUNGS 2 (TWO) TIMES DAILY.  Dispense: 10.2 g; Refill: 0  2. Other eczema - montelukast (SINGULAIR) 10 MG tablet; TAKE 1 TABLET (10 MG TOTAL) BY MOUTH AT BEDTIME.  Dispense: 30 tablet; Refill: 4 - triamcinolone ointment (KENALOG) 0.5 %; Apply 1 Application topically 2 (two) times daily.  Dispense: 45 g; Refill: 1    Return in about 6 months (around 01/17/2024) for PCP for chronic conditions.  The patient was given clear instructions to go to ER or return to medical center if symptoms don't improve, worsen or new  problems develop. The patient verbalized understanding. The patient was told to call to get lab results if they haven't heard anything in the next week.      Georgian Co, PA-C [facility] [place], [place] [phone]   07/20/2023, 4:06 PM

## 2023-10-11 ENCOUNTER — Other Ambulatory Visit: Payer: Self-pay | Admitting: Family Medicine

## 2023-10-11 DIAGNOSIS — J4540 Moderate persistent asthma, uncomplicated: Secondary | ICD-10-CM

## 2023-10-11 MED ORDER — ALBUTEROL SULFATE HFA 108 (90 BASE) MCG/ACT IN AERS
1.0000 | INHALATION_SPRAY | Freq: Four times a day (QID) | RESPIRATORY_TRACT | 1 refills | Status: DC | PRN
Start: 2023-10-11 — End: 2024-01-09

## 2023-10-11 NOTE — Telephone Encounter (Signed)
Copied from CRM [id number]. Topic: Clinical - Medication Refill >> Oct 11, 2023 10:16 AM Elle L wrote: Most Recent Primary Care Visit:  Provider: Anders Simmonds  Department: [facility]  Visit Type: OFFICE VISIT  Date: 07/20/2023  Medication: albuterol (VENTOLIN HFA) 108 (90 Base) MCG/ACT inhaler   Has the patient contacted their pharmacy? Yes  Is this the correct pharmacy for this prescription? Yes If no, delete pharmacy and type the correct one.  This is the patient's preferred pharmacy:  [facility] [address] [place], [place] - [address] AT [facility] [address] [place] [address] Phone: [phone] Fax: [phone]  Has the prescription been filled recently? No  Is the patient out of the medication? Yes  Has the patient been seen for an appointment in the last year OR does the patient have an upcoming appointment? Yes  Can we respond through MyChart? Yes  Agent: Please be advised that Rx refills may take up to 3 business days. We ask that you follow-up with your pharmacy.

## 2024-01-08 ENCOUNTER — Other Ambulatory Visit: Payer: Self-pay | Admitting: Family Medicine

## 2024-01-08 DIAGNOSIS — J4540 Moderate persistent asthma, uncomplicated: Secondary | ICD-10-CM

## 2024-01-17 ENCOUNTER — Ambulatory Visit: Payer: Self-pay

## 2024-01-17 ENCOUNTER — Ambulatory Visit: Payer: BLUE CROSS/BLUE SHIELD | Attending: Family Medicine | Admitting: Family Medicine

## 2024-01-17 NOTE — Telephone Encounter (Signed)
  Chief Complaint: Abdominal pain Symptoms: right sided upper abdominal pain Frequency: 2-3 weeks ago Pertinent Negatives: Patient denies Vomiting, nausea, Disposition: [] ED /[] Urgent Care (no appt availability in office) / [] Appointment(In office/virtual)/ []  [facility]/ [] Home Care/ [] Refused Recommended Disposition /[] [facility]/ [x]  Follow-up with PCP Additional Notes: patient calling initially to reschedule an appointment-patient endorses right upper abdominal pain and was sent to nurse triage. Patient states pain has been going on for 2-3 weeks. Denies vomiting and nausea. Per protocol, patient is recommended to be seen within 24 hours but no availability per decision tree. Patient will need a phone call back to reschedule. Patient verbalized understanding and all questions answered.    Copied from CRM [id number]. Topic: Clinical - Red Word Triage >> Jan 17, 2024  3:58 PM Ethelle Herb L wrote: Red Word that prompted transfer to Nurse Triage: pain in stomach, rating it 6/7 out of 10 Reason for Disposition  [1] MODERATE pain (e.g., interferes with normal activities) AND [2] comes and goes (cramps) AND [3] present > 24 hours  (Exception: Pain with Vomiting or Diarrhea - see that Guideline.)  Answer Assessment - Initial Assessment Questions 1. LOCATION: "Where does it hurt?"      Right sided abdominal pain 2. RADIATION: "Does the pain shoot anywhere else?" (e.g., chest, back)     Radiates into chest 3. ONSET: "When did the pain begin?" (e.g., minutes, hours or days ago)      Started 2-3 weeks ago 4. SUDDEN: "Gradual or sudden onset?"     gradual 5. PATTERN "Does the pain come and go, or is it constant?"    - If it comes and goes: "How long does it last?" "Do you have pain now?"     (Note: Comes and goes means the pain is intermittent. It goes away completely between bouts.)    - If constant: "Is it getting better, staying the same, or getting worse?"      (Note: Constant  means the pain never goes away completely; most serious pain is constant and gets worse.)      Comes and goes 6. SEVERITY: "How bad is the pain?"  (e.g., Scale 1-10; mild, moderate, or severe)    - MILD (1-3): Doesn't interfere with normal activities, abdomen soft and not tender to touch..     - MODERATE (4-7): Interferes with normal activities or awakens from sleep, abdomen tender to touch.     - SEVERE (8-10): Excruciating pain, doubled over, unable to do any normal activities.       No pain currently 7. RECURRENT SYMPTOM: "Have you ever had this type of stomach pain before?" If Yes, ask: "When was the last time?" and "What happened that time?"      Yes-earlier today 8. AGGRAVATING FACTORS: "Does anything seem to cause this pain?" (e.g., foods, stress, alcohol)     unsure 9. CARDIAC SYMPTOMS: "Do you have any of the following symptoms: chest pain, difficulty breathing, sweating, nausea?"     no 10. OTHER SYMPTOMS: "Do you have any other symptoms?" (e.g., back pain, diarrhea, fever, urination pain, vomiting)       diarrhea 11. PREGNANCY: "Is there any chance you are pregnant?" "When was your last menstrual period?"       no  Protocols used: Abdominal Pain - Upper-A-AH

## 2024-01-18 NOTE — Telephone Encounter (Signed)
Can you reschedule patient.

## 2024-02-01 ENCOUNTER — Other Ambulatory Visit (HOSPITAL_COMMUNITY)
Admission: RE | Admit: 2024-02-01 | Discharge: 2024-02-01 | Disposition: A | Discharge status: Home / Self Care | Source: Ambulatory Visit | Attending: Internal Medicine | Admitting: Internal Medicine

## 2024-02-01 ENCOUNTER — Encounter: Payer: Self-pay | Admitting: Family Medicine

## 2024-02-01 ENCOUNTER — Ambulatory Visit: Attending: Internal Medicine | Admitting: Internal Medicine

## 2024-02-01 ENCOUNTER — Encounter: Payer: Self-pay | Admitting: Internal Medicine

## 2024-02-01 VITALS — BP 145/89 | HR 75 | Temp 98.0°F | Ht 64.0 in | Wt 187.0 lb

## 2024-02-01 DIAGNOSIS — Z1159 Encounter for screening for other viral diseases: Secondary | ICD-10-CM | POA: Diagnosis not present

## 2024-02-01 DIAGNOSIS — J4540 Moderate persistent asthma, uncomplicated: Secondary | ICD-10-CM

## 2024-02-01 DIAGNOSIS — Z114 Encounter for screening for human immunodeficiency virus [HIV]: Secondary | ICD-10-CM

## 2024-02-01 DIAGNOSIS — Z113 Encounter for screening for infections with a predominantly sexual mode of transmission: Secondary | ICD-10-CM

## 2024-02-01 MED ORDER — MOMETASONE FURO-FORMOTEROL FUM 100-5 MCG/ACT IN AERO
2.0000 | INHALATION_SPRAY | Freq: Two times a day (BID) | RESPIRATORY_TRACT | 5 refills | Status: DC
Start: 2024-02-01 — End: 2024-03-28

## 2024-02-01 MED ORDER — ALBUTEROL SULFATE HFA 108 (90 BASE) MCG/ACT IN AERS
2.0000 | INHALATION_SPRAY | Freq: Four times a day (QID) | RESPIRATORY_TRACT | 4 refills | Status: DC | PRN
Start: 2024-02-01 — End: 2024-03-28

## 2024-02-01 NOTE — Patient Instructions (Signed)
 VISIT SUMMARY:  Today, you came in for a refill on your inhaler and to get screened for sexually transmitted infections (STIs). We discussed your asthma management and the need for consistent use of a maintenance inhaler. We also talked about the importance of using protection to prevent STIs.  YOUR PLAN:  -ASTHMA, UNCONTROLLED: Your asthma is currently not well-controlled because you haven't been using a maintenance inhaler. Asthma is a condition where your airways become inflamed and narrow, making it hard to breathe. We are switching you to a Dulera inhaler, which you should use two puffs in the morning and two puffs in the evening. Remember to rinse your mouth after using it to prevent any infections. You will also continue using your Ventolin  inhaler as needed for immediate relief. Additionally, we are giving you a peak flow meter to monitor your lung function. It's important to use the Dulera inhaler consistently to keep your asthma under control.  -STI SCREENING: You requested screening for sexually transmitted infections (STIs) because you have had unprotected sex in a non-monogamous relationship. STIs are infections that are passed from one person to another through sexual contact. We performed a vaginal swab to test for chlamydia, gonorrhea, and trichomonas, and a blood draw to test for HIV, hepatitis C, and syphilis. We also discussed the importance of using condoms consistently to prevent the transmission of STIs.  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 There are no current lesions. We discussed the importance of using condoms consistently to prevent the transmission of STIs. If you develop any genital lesions, please seek medical attention for further testing.  INSTRUCTIONS:  Please follow up for your STI screening results. Use the Dulera inhaler as prescribed and monitor your lung function with the peak flow meter. If you  develop any symptoms or have any concerns, please schedule an appointment.

## 2024-02-01 NOTE — Progress Notes (Signed)
 Patient ID: Lauren Terry, female    DOB: 1986/08/26  MRN: [id number]  CC: Follow-up (Follow-up. Med refill. /Requesting routine STD testing due to dark spots on skin /Yes to pap for another appt/ physical. )   Subjective: Lauren Terry is a 38 y.o. female who presents for chronic ds management. PCP is Dr. Adan Holms Her concerns today include:  Patient with history of moderate persistent asthma and eczema   Discussed the use of AI scribe software for clinical note transcription with the patient, who gave verbal consent to proceed.  History of Present Illness Lauren Terry is a 38 year old female who presents for a refill on her inhaler and STI screening.  She uses a Ventolin  inhaler two to three times daily and has been without a Symbicort  inhaler for about a year due to cost issues. A previous refill for Symbicort  was not received. She is currently taking Singulair  and recently received a refill. She requires a refill for her Ventolin  inhaler.  She is sexually active with one female partner, who has another partner, and has not consistently used condoms. She requests STI screening and is agreeable to be screened for chlamydia, gonorrhea, trichomonas, HIV, hepatitis C, and syphilis. She has no symptoms such as vaginal discharge or itching. Her last HIV test was two years ago. 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 30 tablet 4   triamcinolone  ointment (KENALOG ) 0.5 % Apply 1 Application topically 2 (two) times daily. 45 g 1   No current facility-administered medications  on file prior to visit.    No Known Allergies  Social History   Socioeconomic History   Marital status: Single    Spouse name: Not on file   Number of children: Not on file   Years of education: Not on file   Highest education level: Not on file  Occupational History   Not on file  Tobacco Use   Smoking status: Some Days    Current packs/day: 0.01    Types: Cigarettes   Smokeless tobacco: Never  Vaping Use   Vaping status: Never Used  Substance and Sexual Activity   Alcohol use: Not Currently   Drug use: Yes    Types: Marijuana   Sexual activity: Yes  Other Topics Concern   Not on file  Social History Narrative   Patient is a single mother of 4 children ( 2 boys and 2 girls)   Social Drivers of Corporate investment banker Strain: Low Risk  (07/24/2023)   Overall Financial Resource Strain (CARDIA)    Difficulty of Paying Living Expenses: Not hard at all  Food Insecurity: Food Insecurity Present (07/24/2023)   Hunger Vital Sign    Worried About Running Out of Food in the Last Year: Sometimes true    Ran Out of Food in the Last Year: Sometimes true  Transportation Needs: No Transportation Needs (02/01/2024)   PRAPARE - Administrator, Civil Service (Medical): No    Lack of Transportation (Non-Medical): No  Physical Activity: Not on file  Stress: Stress Concern Present (07/24/2023)   [employer] of Occupational Health - Occupational Stress Questionnaire    Feeling of Stress : To some extent  Social Connections: Socially Isolated (07/24/2023)   Social Connection and Isolation Panel [NHANES]    Frequency of Communication with Friends and Family: More than three times a week    Frequency of Social Gatherings with Friends and Family: Once a week    Attends Religious Services: Never    Database administrator or Organizations: No    Attends Banker Meetings: Never    Marital Status: Never married  Intimate Partner Violence: Not At Risk  (02/01/2024)   Humiliation, Afraid, Rape, and Kick questionnaire    Fear of Current or Ex-Partner: No    Emotionally Abused: No    Physically Abused: No    Sexually Abused: No    Family History  Problem Relation Age of Onset   Diabetes Maternal Aunt    Cancer Paternal Grandmother    Breast cancer Paternal Grandmother     History reviewed. No pertinent surgical history.  ROS: Review of Systems Negative except as stated above  PHYSICAL EXAM: BP (!) 145/89 (BP Location: Left Arm, Patient Position: Sitting, Cuff Size: Normal)   Pulse 75   Temp 98 F (36.7 C) (Oral)   Ht 5\' 4"  (1.626 m)   Wt 187 lb (84.8 kg)   SpO2 100%   BMI 32.10 kg/m   Physical Exam  General appearance - alert, well appearing, young to middle-aged African-American female and in no distress Mental status - normal mood, behavior, speech, dress, motor activity, and thought processes Chest -few scattered wheezes heard.  Otherwise breath sounds clear and equal bilaterally. Heart - normal rate, regular rhythm, normal S1, S2, no murmurs, rubs, clicks or gallops      Latest Ref Rng & Units 01/10/2023    3:09 AM 10/22/2018    7:37 AM  CMP  Glucose 70 - 99 mg/dL 098  98   BUN 6 - 20 mg/dL 10  11   Creatinine 1.19 - 1.00 mg/dL 1.47  8.29   Sodium 562 - 145 mmol/L 138  137   Potassium 3.5 - 5.1 mmol/L 3.3  3.7   Chloride 98 - 111 mmol/L 107  106   CO2 22 - 32 mmol/L 22  24   Calcium 8.9 - 10.3 mg/dL 8.6  8.7   Total Protein 6.5 - 8.1 g/dL  7.2   Total Bilirubin 0.3 - 1.2 mg/dL  0.2   Alkaline Phos 38 - 126 U/L  83   AST 15 - 41 U/L  24   ALT 0 - 44 U/L  21    Lipid Panel  No results found for: "CHOL", "TRIG", "HDL", "CHOLHDL", "VLDL", "LDLCALC", "LDLDIRECT"  CBC    Component Value Date/Time   WBC 12.0 (H) 01/10/2023 0309   RBC 4.58 01/10/2023 0309   HGB 12.8 01/10/2023 0309   HCT 40.3 01/10/2023 0309   PLT 340 01/10/2023 0309   MCV 88.0 01/10/2023 0309   MCH 27.9 01/10/2023 0309   MCHC 31.8  01/10/2023 0309   RDW 14.4 01/10/2023 0309    ASSESSMENT AND PLAN: 1. Moderate persistent asthma without complication (Primary) Not well-controlled.  She has been out of Symbicort  and looks like it is not on the preferred list for her insurance.  Will use Dulera instead; went over the difference between the maintenance inhaler versus Ventolin  being her rescue inhaler. - Prescribe Dulera inhaler, two puffs  in the morning and two puffs in the evening. - Instruct to rinse mouth after using Dulera to prevent thrush. - Prescribe Ventolin  inhaler for rescue use. - Prescribe a peak flow meter to monitor lung function. - Educate on the importance of using Dulera consistently to control asthma symptoms. - mometasone-formoterol  (DULERA) 100-5 MCG/ACT AERO; Inhale 2 puffs into the lungs 2 (two) times daily.  Dispense: 13 g; Refill: 5 - albuterol  (VENTOLIN  HFA) 108 (90 Base) MCG/ACT inhaler; Inhale 2 puffs into the lungs every 6 (six) hours as needed for wheezing or shortness of breath.  Dispense: 18 g; Refill: 4 - For home use only DME Other see comment  2. Routine screening for STI (sexually transmitted infection)  Emphasized protection by using condoms consistently to prevent STI transmission since she is not in a monogamous relationship.  Went over signs or symptoms that would suggest genital herpes.  Advised to be seen if she has any of the symptoms. - Perform vaginal swab for chlamydia, gonorrhea, and trichomonas. - Perform blood draw for HIV, hepatitis C, and syphilis screening. - Cervicovaginal ancillary only - RPR w/reflex to TrepSure - HIV antibody (with reflex)  3. Screening for HIV (human immunodeficiency virus) - HIV antibody (with reflex)  4. Need for hepatitis C screening test - Hepatitis C Antibody  Assessment and Plan   Patient was given the opportunity to ask questions.  Patient verbalized understanding of the plan and was able to repeat key elements of the plan.   This  documentation was completed using Paediatric nurse.  Any transcriptional errors are unintentional.  Orders Placed This Encounter  Procedures   For home use only DME Other see comment   Hepatitis C Antibody   RPR w/reflex to TrepSure   HIV antibody (with reflex)     Requested Prescriptions   Signed Prescriptions Disp Refills   mometasone-formoterol  (DULERA) 100-5 MCG/ACT AERO 13 g 5    Sig: Inhale 2 puffs into the lungs 2 (two) times daily.   albuterol  (VENTOLIN  HFA) 108 (90 Base) MCG/ACT inhaler 18 g 4    Sig: Inhale 2 puffs into the lungs every 6 (six) hours as needed for wheezing or shortness of breath.    Return in about 8 weeks (around 03/28/2024) for schedule 8 wks f/u for PAP with PCP Dr. Newlin.  Concetta Dee, MD, FACP

## 2024-02-02 ENCOUNTER — Ambulatory Visit: Payer: Self-pay | Admitting: Internal Medicine

## 2024-02-02 LAB — HEPATITIS C ANTIBODY: Hep C Virus Ab: NONREACTIVE

## 2024-02-02 LAB — CERVICOVAGINAL ANCILLARY ONLY
Candida Glabrata: NEGATIVE
Candida Vaginitis: NEGATIVE
Chlamydia: NEGATIVE
Comment: NEGATIVE
Comment: NEGATIVE
Comment: NEGATIVE
Comment: NEGATIVE
Comment: NORMAL
Neisseria Gonorrhea: NEGATIVE
Trichomonas: NEGATIVE

## 2024-02-02 LAB — RPR W/REFLEX TO TREPSURE: RPR: NONREACTIVE

## 2024-02-02 LAB — TREPONEMAL ANTIBODIES, TPPA: Treponemal Antibodies, TPPA: NONREACTIVE

## 2024-02-02 LAB — HIV ANTIBODY (ROUTINE TESTING W REFLEX): HIV Screen 4th Generation wRfx: NONREACTIVE

## 2024-03-28 ENCOUNTER — Ambulatory Visit: Attending: Family Medicine | Admitting: Family Medicine

## 2024-03-28 ENCOUNTER — Encounter: Payer: Self-pay | Admitting: Family Medicine

## 2024-03-28 ENCOUNTER — Other Ambulatory Visit (HOSPITAL_COMMUNITY)
Admission: RE | Admit: 2024-03-28 | Discharge: 2024-03-28 | Disposition: A | Discharge status: Home / Self Care | Source: Ambulatory Visit | Attending: Family Medicine | Admitting: Family Medicine

## 2024-03-28 VITALS — BP 135/89 | HR 84 | Wt 180.6 lb

## 2024-03-28 DIAGNOSIS — Z124 Encounter for screening for malignant neoplasm of cervix: Secondary | ICD-10-CM

## 2024-03-28 DIAGNOSIS — Z113 Encounter for screening for infections with a predominantly sexual mode of transmission: Secondary | ICD-10-CM | POA: Insufficient documentation

## 2024-03-28 DIAGNOSIS — Z130 Encounter for screening for diseases of the blood and blood-forming organs and certain disorders involving the immune mechanism: Secondary | ICD-10-CM

## 2024-03-28 DIAGNOSIS — Z13228 Encounter for screening for other metabolic disorders: Secondary | ICD-10-CM

## 2024-03-28 DIAGNOSIS — Z0001 Encounter for general adult medical examination with abnormal findings: Secondary | ICD-10-CM

## 2024-03-28 DIAGNOSIS — J4540 Moderate persistent asthma, uncomplicated: Secondary | ICD-10-CM | POA: Diagnosis not present

## 2024-03-28 DIAGNOSIS — Z13220 Encounter for screening for lipoid disorders: Secondary | ICD-10-CM

## 2024-03-28 DIAGNOSIS — Z131 Encounter for screening for diabetes mellitus: Secondary | ICD-10-CM

## 2024-03-28 DIAGNOSIS — L308 Other specified dermatitis: Secondary | ICD-10-CM

## 2024-03-28 MED ORDER — TRIAMCINOLONE ACETONIDE 0.5 % EX OINT
1.0000 | TOPICAL_OINTMENT | Freq: Two times a day (BID) | CUTANEOUS | 1 refills | Status: AC
Start: 2024-03-28 — End: ?

## 2024-03-28 MED ORDER — ALBUTEROL SULFATE HFA 108 (90 BASE) MCG/ACT IN AERS
2.0000 | INHALATION_SPRAY | Freq: Four times a day (QID) | RESPIRATORY_TRACT | 4 refills | Status: DC | PRN
Start: 2024-03-28 — End: 2024-04-24

## 2024-03-28 MED ORDER — BUDESONIDE-FORMOTEROL FUMARATE 160-4.5 MCG/ACT IN AERO: 2.0000 | INHALATION_SPRAY | Freq: Two times a day (BID) | RESPIRATORY_TRACT | 3 refills | Status: AC

## 2024-03-28 NOTE — Progress Notes (Signed)
 Subjective:  Patient ID: Lauren Terry, female    DOB: July 17, 1986  Age: 38 y.o. MRN: [id number]  CC: Gynecologic Exam (Inhaler dossie is to expensive patiet is wanting to go back previous inhaler )     Discussed the use of AI scribe software for clinical note transcription with the patient, who gave verbal consent to proceed.  History of Present Illness Lauren Terry is a 38 year old female with history of asthma who presents for a gynecologic exam and medication refills. She is due for Pap smear and would also like STD screening.  She is currently using Dulera for asthma management but finds it expensive and requests to switch back to Symbicort , which she used previously. She has not experienced any recent asthma flares and requests a refill for her albuterol  inhaler, Ventolin .  She requests a refill for her triamcinolone  ointment, which she has used previously for skin conditions.    Past Medical History:  Diagnosis Date   Asthma     No past surgical history on file.  Family History  Problem Relation Age of Onset   Diabetes Maternal Aunt    Cancer Paternal Grandmother    Breast cancer Paternal Grandmother     Social History   Socioeconomic History   Marital status: Single    Spouse name: Not on file   Number of children: Not on file   Years of education: Not on file   Highest education level: Not on file  Occupational History   Not on file  Tobacco Use   Smoking status: Some Days    Current packs/day: 0.01    Types: Cigarettes   Smokeless tobacco: Never  Vaping Use   Vaping status: Never Used  Substance and Sexual Activity   Alcohol use: Not Currently   Drug use: Yes    Types: Marijuana   Sexual activity: Yes  Other Topics Concern   Not on file  Social History Narrative   Patient is a single mother of 4 children ( 2 boys and 2 girls)   Social Drivers of Corporate investment banker Strain: Low Risk  (07/24/2023)   Overall Financial Resource  Strain (CARDIA)    Difficulty of Paying Living Expenses: Not hard at all  Food Insecurity: Food Insecurity Present (07/24/2023)   Hunger Vital Sign    Worried About Running Out of Food in the Last Year: Sometimes true    Ran Out of Food in the Last Year: Sometimes true  Transportation Needs: No Transportation Needs (02/01/2024)   PRAPARE - Administrator, Civil Service (Medical): No    Lack of Transportation (Non-Medical): No  Physical Activity: Not on file  Stress: Stress Concern Present (07/24/2023)   [employer] of Occupational Health - Occupational Stress Questionnaire    Feeling of Stress : To some extent  Social Connections: Socially Isolated (07/24/2023)   Social Connection and Isolation Panel    Frequency of Communication with Friends and Family: More than three times a week    Frequency of Social Gatherings with Friends and Family: Once a week    Attends Religious Services: Never    Database administrator or Organizations: No    Attends Engineer, structural: Never    Marital Status: Never married    No Known Allergies  Outpatient Medications Prior to Visit  Medication Sig Dispense Refill   montelukast  (SINGULAIR ) 10 MG tablet TAKE 1 TABLET (10 MG TOTAL) BY MOUTH AT BEDTIME. 30 tablet 4  albuterol  (VENTOLIN  HFA) 108 (90 Base) MCG/ACT inhaler Inhale 2 puffs into the lungs every 6 (six) hours as needed for wheezing or shortness of breath. 18 g 4   triamcinolone  ointment (KENALOG ) 0.5 % Apply 1 Application topically 2 (two) times daily. 45 g 1   mometasone -formoterol  (DULERA) 100-5 MCG/ACT AERO Inhale 2 puffs into the lungs 2 (two) times daily. (Patient not taking: Reported on 03/28/2024) 13 g 5   No facility-administered medications prior to visit.     ROS Review of Systems  Constitutional:  Negative for activity change and appetite change.  HENT:  Negative for sinus pressure and sore throat.   Respiratory:  Negative for chest tightness,  shortness of breath and wheezing.   Cardiovascular:  Negative for chest pain and palpitations.  Gastrointestinal:  Negative for abdominal distention, abdominal pain and constipation.  Genitourinary: Negative.   Musculoskeletal: Negative.   Psychiatric/Behavioral:  Negative for behavioral problems and dysphoric mood.     Objective:  BP 135/89 (BP Location: Right Arm, Patient Position: Sitting, Cuff Size: Normal)   Pulse 84   Wt 180 lb 9.6 oz (81.9 kg)   SpO2 97%   BMI 31.00 kg/m      03/28/2024    9:53 AM 02/01/2024    9:59 AM 07/20/2023    3:48 PM  BP/Weight  Systolic BP 135 145 127  Diastolic BP 89 89 80  Wt. (Lbs) 180.6 187 178  BMI 31 kg/m2 32.1 kg/m2 30.55 kg/m2      Physical Exam Constitutional:      Appearance: She is well-developed.  Cardiovascular:     Rate and Rhythm: Normal rate.     Heart sounds: Normal heart sounds. No murmur heard. Pulmonary:     Effort: Pulmonary effort is normal.     Breath sounds: Normal breath sounds. No wheezing or rales.  Chest:     Chest wall: No tenderness.  Abdominal:     General: Bowel sounds are normal. There is no distension.     Palpations: Abdomen is soft. There is no mass.     Tenderness: There is no abdominal tenderness.  Genitourinary:    Comments: External genitalia, vagina, cervix, adnexa-normal Musculoskeletal:        General: Normal range of motion.     Right lower leg: No edema.     Left lower leg: No edema.  Neurological:     Mental Status: She is alert and oriented to person, place, and time.  Psychiatric:        Mood and Affect: Mood normal.        Latest Ref Rng & Units 01/10/2023    3:09 AM 10/22/2018    7:37 AM  CMP  Glucose 70 - 99 mg/dL 879  98   BUN 6 - 20 mg/dL 10  11   Creatinine 9.55 - 1.00 mg/dL 9.01  9.24   Sodium 864 - 145 mmol/L 138  137   Potassium 3.5 - 5.1 mmol/L 3.3  3.7   Chloride 98 - 111 mmol/L 107  106   CO2 22 - 32 mmol/L 22  24   Calcium 8.9 - 10.3 mg/dL 8.6  8.7   Total  Protein 6.5 - 8.1 g/dL  7.2   Total Bilirubin 0.3 - 1.2 mg/dL  0.2   Alkaline Phos 38 - 126 U/L  83   AST 15 - 41 U/L  24   ALT 0 - 44 U/L  21     Lipid Panel  No results  found for: CHOL, TRIG, HDL, CHOLHDL, VLDL, LDLCALC, LDLDIRECT  CBC    Component Value Date/Time   WBC 12.0 (H) 01/10/2023 0309   RBC 4.58 01/10/2023 0309   HGB 12.8 01/10/2023 0309   HCT 40.3 01/10/2023 0309   PLT 340 01/10/2023 0309   MCV 88.0 01/10/2023 0309   MCH 27.9 01/10/2023 0309   MCHC 31.8 01/10/2023 0309   RDW 14.4 01/10/2023 0309    No results found for: HGBA1C    1. Annual visit for general adult medical examination with abnormal findings (Primary) Counseled on 150 minutes of exercise per week, healthy eating (including decreased daily intake of saturated fats, cholesterol, added sugars, sodium), STI prevention, routine healthcare maintenance.   2. Moderate persistent asthma without complication - albuterol  (VENTOLIN  HFA) 108 (90 Base) MCG/ACT inhaler; Inhale 2 puffs into the lungs every 6 (six) hours as needed for wheezing or shortness of breath.  Dispense: 18 g; Refill: 4  3. Other eczema - triamcinolone  ointment (KENALOG ) 0.5 %; Apply 1 Application topically 2 (two) times daily.  Dispense: 45 g; Refill: 1  4. Screening for cervical cancer - Cytology - PAP  5. Screening for STD (sexually transmitted disease) - Cervicovaginal ancillary only  6. Screening for iron deficiency anemia - CBC with Differential/Platelet  7. Encounter for lipid screening for cardiovascular disease - LP+Non-HDL Cholesterol  8. Screening for metabolic disorder - CMP14+EGFR  9. Screening for diabetes mellitus - Hemoglobin A1c   Meds ordered this encounter  Medications   budesonide -formoterol  (SYMBICORT ) 160-4.5 MCG/ACT inhaler    Sig: Inhale 2 puffs into the lungs 2 (two) times daily.    Dispense:  1 each    Refill:  3   albuterol  (VENTOLIN  HFA) 108 (90 Base) MCG/ACT inhaler    Sig:  Inhale 2 puffs into the lungs every 6 (six) hours as needed for wheezing or shortness of breath.    Dispense:  18 g    Refill:  4   triamcinolone  ointment (KENALOG ) 0.5 %    Sig: Apply 1 Application topically 2 (two) times daily.    Dispense:  45 g    Refill:  1    Follow-up: Return in about 6 months (around 09/28/2024) for Chronic medical conditions.       Corrina Sabin, MD, FAAFP. [facility] and [facility] [place], [place] [phone]   03/28/2024, 1:12 PM

## 2024-03-28 NOTE — Patient Instructions (Signed)
 VISIT SUMMARY:  Today, you came in for a routine follow-up and to get refills for your medications. We discussed your asthma management and other health maintenance needs.  YOUR PLAN:  -ASTHMA: Asthma is a condition where your airways narrow and swell, making it difficult to breathe. Your asthma is currently well-controlled with Dulera, but we will switch you back to Symbicort  due to cost. We will also refill your Ventolin  inhaler for use as needed.  -MEDICATION REFILL: You requested a refill for your triamcinolone  ointment, which is used to treat skin conditions. We have provided the refill for you.  -GENERAL HEALTH MAINTENANCE: General health maintenance includes routine check-ups and screenings to ensure overall well-being. We will perform a pap smear and order labs to check your cholesterol, kidney function, liver function, and screen for diabetes.  INSTRUCTIONS:  Please schedule a follow-up appointment in six months to review your lab results and manage your condition. A doctor's note for today's visit has been provided.

## 2024-03-29 ENCOUNTER — Ambulatory Visit: Payer: Self-pay | Admitting: Family Medicine

## 2024-03-29 LAB — CBC WITH DIFFERENTIAL/PLATELET
Basophils Absolute: 0.1 x10E3/uL (ref 0.0–0.2)
Basos: 1 %
EOS (ABSOLUTE): 0.1 x10E3/uL (ref 0.0–0.4)
Eos: 1 %
Hematocrit: 43.7 % (ref 34.0–46.6)
Hemoglobin: 13.4 g/dL (ref 11.1–15.9)
Immature Grans (Abs): 0 x10E3/uL (ref 0.0–0.1)
Immature Granulocytes: 0 %
Lymphocytes Absolute: 2.1 x10E3/uL (ref 0.7–3.1)
Lymphs: 26 %
MCH: 27.6 pg (ref 26.6–33.0)
MCHC: 30.7 g/dL — ABNORMAL LOW (ref 31.5–35.7)
MCV: 90 fL (ref 79–97)
Monocytes Absolute: 0.6 x10E3/uL (ref 0.1–0.9)
Monocytes: 7 %
Neutrophils Absolute: 5.2 x10E3/uL (ref 1.4–7.0)
Neutrophils: 65 %
Platelets: 428 x10E3/uL (ref 150–450)
RBC: 4.86 x10E6/uL (ref 3.77–5.28)
RDW: 13.3 % (ref 11.7–15.4)
WBC: 8.1 x10E3/uL (ref 3.4–10.8)

## 2024-03-29 LAB — CERVICOVAGINAL ANCILLARY ONLY
Bacterial Vaginitis (gardnerella): NEGATIVE
Candida Glabrata: NEGATIVE
Candida Vaginitis: NEGATIVE
Chlamydia: NEGATIVE
Comment: NEGATIVE
Comment: NEGATIVE
Comment: NEGATIVE
Comment: NEGATIVE
Comment: NEGATIVE
Comment: NORMAL
Neisseria Gonorrhea: NEGATIVE
Trichomonas: NEGATIVE

## 2024-03-29 LAB — CMP14+EGFR
ALT: 18 IU/L (ref 0–32)
AST: 21 IU/L (ref 0–40)
Albumin: 4.7 g/dL (ref 3.9–4.9)
Alkaline Phosphatase: 120 IU/L (ref 44–121)
BUN/Creatinine Ratio: 10 (ref 9–23)
BUN: 9 mg/dL (ref 6–20)
Bilirubin Total: 0.4 mg/dL (ref 0.0–1.2)
CO2: 21 mmol/L (ref 20–29)
Calcium: 10.2 mg/dL (ref 8.7–10.2)
Chloride: 102 mmol/L (ref 96–106)
Creatinine, Ser: 0.89 mg/dL (ref 0.57–1.00)
Globulin, Total: 2.8 g/dL (ref 1.5–4.5)
Glucose: 84 mg/dL (ref 70–99)
Potassium: 4.8 mmol/L (ref 3.5–5.2)
Sodium: 140 mmol/L (ref 134–144)
Total Protein: 7.5 g/dL (ref 6.0–8.5)
eGFR: 86 mL/min/1.73 (ref >59)

## 2024-03-29 LAB — LP+NON-HDL CHOLESTEROL
Cholesterol, Total: 150 mg/dL (ref 100–199)
HDL: 38 mg/dL — ABNORMAL LOW (ref >39)
LDL Chol Calc (NIH): 99 mg/dL (ref 0–99)
Total Non-HDL-Chol (LDL+VLDL): 112 mg/dL (ref 0–129)
Triglycerides: 62 mg/dL (ref 0–149)
VLDL Cholesterol Cal: 13 mg/dL (ref 5–40)

## 2024-03-29 LAB — HEMOGLOBIN A1C
Est. average glucose Bld gHb Est-mCnc: 100 mg/dL
Hgb A1c MFr Bld: 5.1 % (ref 4.8–5.6)

## 2024-04-01 ENCOUNTER — Ambulatory Visit: Admitting: Family Medicine

## 2024-04-02 LAB — CYTOLOGY - PAP
Adequacy: ABSENT
Comment: NEGATIVE
Diagnosis: NEGATIVE
High risk HPV: NEGATIVE

## 2024-04-24 ENCOUNTER — Encounter: Payer: Self-pay | Admitting: Family Medicine

## 2024-04-24 ENCOUNTER — Ambulatory Visit: Attending: Family Medicine | Admitting: Family Medicine

## 2024-04-24 VITALS — BP 136/84 | HR 84 | Temp 99.0°F | Ht 64.0 in | Wt 183.0 lb

## 2024-04-24 DIAGNOSIS — U071 COVID-19: Secondary | ICD-10-CM | POA: Diagnosis not present

## 2024-04-24 DIAGNOSIS — J4540 Moderate persistent asthma, uncomplicated: Secondary | ICD-10-CM

## 2024-04-24 LAB — POC COVID19/FLU A&B COMBO
Covid Antigen, POC: POSITIVE — AB
Influenza A Antigen, POC: NEGATIVE
Influenza B Antigen, POC: NEGATIVE

## 2024-04-24 LAB — POCT RAPID STREP A (OFFICE): Rapid Strep A Screen: NEGATIVE

## 2024-04-24 MED ORDER — BENZONATATE 100 MG PO CAPS: 100.0000 mg | ORAL_CAPSULE | Freq: Two times a day (BID) | ORAL | 0 refills | Status: AC | PRN

## 2024-04-24 MED ORDER — PREDNISONE 20 MG PO TABS: 20.0000 mg | ORAL_TABLET | Freq: Every day | ORAL | 0 refills | Status: AC

## 2024-04-24 MED ORDER — ALBUTEROL SULFATE HFA 108 (90 BASE) MCG/ACT IN AERS
2.0000 | INHALATION_SPRAY | Freq: Four times a day (QID) | RESPIRATORY_TRACT | 4 refills | Status: AC | PRN
Start: 2024-04-24 — End: ?

## 2024-04-24 MED ORDER — NIRMATRELVIR/RITONAVIR (PAXLOVID)TABLET: 3.0000 | ORAL_TABLET | Freq: Two times a day (BID) | ORAL | 0 refills | Status: AC

## 2024-04-24 NOTE — Progress Notes (Signed)
 Subjective:  Patient ID: Lauren Terry, female    DOB: [birth year]  Age: 38 y.o. MRN: [id number]  CC: Cough (Headaches and chills)     Discussed the use of AI scribe software for clinical note transcription with the patient, who gave verbal consent to proceed.  History of Present Illness Lauren Terry is a 38 year old female with asthma who presents with cough, headache, and chills.  Symptoms began two days ago with a severe headache, nasal congestion, and chills, accompanied by a cough and sneezing. She was outside in the rain last week. Her mother has a slight cold. The cough is slightly productive with minimal phlegm. She felt hot last night, suggesting a possible fever. There is no sore throat, diarrhea, vomiting, or significant body aches, but she has a decreased appetite. She took Tylenol Extra Strength and daytime cold and flu medicine yesterday but has not taken any medication today. The headache is painful but not heavy. Her asthma has worsened since the onset of symptoms, and she does not have her rescue inhaler.    Past Medical History:  Diagnosis Date   Asthma     No past surgical history on file.  Family History  Problem Relation Age of Onset   Diabetes Maternal Aunt    Cancer Paternal Grandmother    Breast cancer Paternal Grandmother     Social History   Socioeconomic History   Marital status: Single    Spouse name: Not on file   Number of children: Not on file   Years of education: Not on file   Highest education level: Not on file  Occupational History   Not on file  Tobacco Use   Smoking status: Some Days    Current packs/day: 0.01    Types: Cigarettes   Smokeless tobacco: Never  Vaping Use   Vaping status: Never Used  Substance and Sexual Activity   Alcohol use: Not Currently   Drug use: Yes    Types: Marijuana   Sexual activity: Yes  Other Topics Concern   Not on file  Social History Narrative   Patient is a single mother of 4 children  ( 2 boys and 2 girls)   Social Drivers of Corporate investment banker Strain: Low Risk  (07/24/2023)   Overall Financial Resource Strain (CARDIA)    Difficulty of Paying Living Expenses: Not hard at all  Food Insecurity: Food Insecurity Present (07/24/2023)   Hunger Vital Sign    Worried About Running Out of Food in the Last Year: Sometimes true    Ran Out of Food in the Last Year: Sometimes true  Transportation Needs: No Transportation Needs (02/01/2024)   PRAPARE - Administrator, Civil Service (Medical): No    Lack of Transportation (Non-Medical): No  Physical Activity: Not on file  Stress: Stress Concern Present (07/24/2023)   [employer] of Occupational Health - Occupational Stress Questionnaire    Feeling of Stress : To some extent  Social Connections: Socially Isolated (07/24/2023)   Social Connection and Isolation Panel    Frequency of Communication with Friends and Family: More than three times a week    Frequency of Social Gatherings with Friends and Family: Once a week    Attends Religious Services: Never    Database administrator or Organizations: No    Attends Banker Meetings: Never    Marital Status: Never married    No Known Allergies  Outpatient Medications Prior to Visit  Medication Sig Dispense Refill   budesonide -formoterol  (SYMBICORT ) 160-4.5 MCG/ACT inhaler Inhale 2 puffs into the lungs 2 (two) times daily. 1 each 3   montelukast  (SINGULAIR ) 10 MG tablet TAKE 1 TABLET (10 MG TOTAL) BY MOUTH AT BEDTIME. 30 tablet 4   triamcinolone  ointment (KENALOG ) 0.5 % Apply 1 Application topically 2 (two) times daily. 45 g 1   albuterol  (VENTOLIN  HFA) 108 (90 Base) MCG/ACT inhaler Inhale 2 puffs into the lungs every 6 (six) hours as needed for wheezing or shortness of breath. 18 g 4   No facility-administered medications prior to visit.     ROS Review of Systems  Constitutional:  Positive for chills and fever. Negative for activity  change and appetite change.  HENT:  Negative for sinus pressure and sore throat.   Respiratory:  Positive for cough. Negative for chest tightness, shortness of breath and wheezing.   Cardiovascular:  Negative for chest pain and palpitations.  Gastrointestinal:  Negative for abdominal distention, abdominal pain and constipation.  Genitourinary: Negative.   Musculoskeletal: Negative.   Neurological:  Positive for headaches.  Psychiatric/Behavioral:  Negative for behavioral problems and dysphoric mood.     Objective:  BP 136/84   Pulse 84   Temp 99 F (37.2 C) (Oral)   Ht 5' 4 (1.626 m)   Wt 183 lb (83 kg)   SpO2 99%   BMI 31.41 kg/m      04/24/2024   11:21 AM 03/28/2024    9:53 AM 02/01/2024    9:59 AM  BP/Weight  Systolic BP 136 135 145  Diastolic BP 84 89 89  Wt. (Lbs) 183 180.6 187  BMI 31.41 kg/m2 31 kg/m2 32.1 kg/m2      Physical Exam Constitutional:      Appearance: She is well-developed.  HENT:     Right Ear: Tympanic membrane normal.     Left Ear: Tympanic membrane normal.     Mouth/Throat:     Mouth: Mucous membranes are moist.  Cardiovascular:     Rate and Rhythm: Normal rate.     Heart sounds: Normal heart sounds. No murmur heard. Pulmonary:     Effort: Pulmonary effort is normal.     Breath sounds: Wheezing present. No rales.  Chest:     Chest wall: No tenderness.  Abdominal:     General: Bowel sounds are normal. There is no distension.     Palpations: Abdomen is soft. There is no mass.     Tenderness: There is no abdominal tenderness.  Musculoskeletal:        General: Normal range of motion.     Right lower leg: No edema.     Left lower leg: No edema.  Lymphadenopathy:     Cervical: No cervical adenopathy.  Neurological:     Mental Status: She is alert and oriented to person, place, and time.  Psychiatric:        Mood and Affect: Mood normal.        Latest Ref Rng & Units 03/28/2024   10:42 AM 01/10/2023    3:09 AM 10/22/2018    7:37 AM   CMP  Glucose 70 - 99 mg/dL 84  879  98   BUN 6 - 20 mg/dL 9  10  11    Creatinine 0.57 - 1.00 mg/dL 9.10  9.01  9.24   Sodium 134 - 144 mmol/L 140  138  137   Potassium 3.5 - 5.2 mmol/L 4.8  3.3  3.7   Chloride 96 -  106 mmol/L 102  107  106   CO2 20 - 29 mmol/L 21  22  24    Calcium 8.7 - 10.2 mg/dL 89.7  8.6  8.7   Total Protein 6.0 - 8.5 g/dL 7.5   7.2   Total Bilirubin 0.0 - 1.2 mg/dL 0.4   0.2   Alkaline Phos 44 - 121 IU/L 120   83   AST 0 - 40 IU/L 21   24   ALT 0 - 32 IU/L 18   21     Lipid Panel     Component Value Date/Time   CHOL 150 03/28/2024 1042   TRIG 62 03/28/2024 1042   HDL 38 (L) 03/28/2024 1042   LDLCALC 99 03/28/2024 1042    CBC    Component Value Date/Time   WBC 8.1 03/28/2024 1042   WBC 12.0 (H) 01/10/2023 0309   RBC 4.86 03/28/2024 1042   RBC 4.58 01/10/2023 0309   HGB 13.4 03/28/2024 1042   HCT 43.7 03/28/2024 1042   PLT 428 03/28/2024 1042   MCV 90 03/28/2024 1042   MCH 27.6 03/28/2024 1042   MCH 27.9 01/10/2023 0309   MCHC 30.7 (L) 03/28/2024 1042   MCHC 31.8 01/10/2023 0309   RDW 13.3 03/28/2024 1042   LYMPHSABS 2.1 03/28/2024 1042   EOSABS 0.1 03/28/2024 1042   BASOSABS 0.1 03/28/2024 1042    Lab Results  Component Value Date   HGBA1C 5.1 03/28/2024       Assessment & Plan COVID infection - Perform rapid COVID-19 and influenza testing. - Prescribe Paxlovid  as COVID-19 test is positive. - Advise rest and increased fluid intake. - Prescribe Tessalon  for cough. - Provide work note to stay out of work until Monday, with return to work on Monday with a mask.  Asthma Asthma exacerbation likely triggered by viral infection. - Prescribe prednisone  for wheezing. - Prescribe albuterol  inhaler as a rescue inhaler.     Healthcare maintenance Up to date  Meds ordered this encounter  Medications   benzonatate  (TESSALON ) 100 MG capsule    Sig: Take 1 capsule (100 mg total) by mouth 2 (two) times daily as needed for cough.     Dispense:  20 capsule    Refill:  0   nirmatrelvir /ritonavir  (PAXLOVID ) 20 x 150 MG & 10 x 100MG  TABS    Sig: Take 3 tablets by mouth 2 (two) times daily for 5 days. (Take nirmatrelvir  150 mg two tablets twice daily for 5 days and ritonavir  100 mg one tablet twice daily for 5 days) Patient GFR is 86    Dispense:  30 tablet    Refill:  0   predniSONE  (DELTASONE ) 20 MG tablet    Sig: Take 1 tablet (20 mg total) by mouth daily with breakfast.    Dispense:  5 tablet    Refill:  0   albuterol  (VENTOLIN  HFA) 108 (90 Base) MCG/ACT inhaler    Sig: Inhale 2 puffs into the lungs every 6 (six) hours as needed for wheezing or shortness of breath.    Dispense:  18 g    Refill:  4    Follow-up: Return for previously scheduled appointment.       Corrina Sabin, MD, FAAFP. [facility] and [facility] [place], [place] [phone]   04/24/2024, 12:06 PM

## 2024-09-27 ENCOUNTER — Telehealth: Payer: Self-pay | Admitting: Family Medicine

## 2024-09-27 NOTE — Telephone Encounter (Signed)
 Contacted pt left vm to confirmed appt

## 2024-09-30 ENCOUNTER — Ambulatory Visit: Admitting: Family Medicine

## 2024-11-20 ENCOUNTER — Ambulatory Visit: Admitting: Family Medicine
